# Patient Record
Sex: Female | Born: 1935 | Race: White | Hispanic: No | Smoking: Never smoker
Health system: Southern US, Community
[De-identification: ages and names within clinical notes are randomized; demographics above are authoritative.]

## PROBLEM LIST (undated history)

## (undated) DIAGNOSIS — I10 Essential (primary) hypertension: Secondary | ICD-10-CM

## (undated) DIAGNOSIS — F419 Anxiety disorder, unspecified: Secondary | ICD-10-CM

## (undated) DIAGNOSIS — F0390 Unspecified dementia without behavioral disturbance: Secondary | ICD-10-CM

## (undated) DIAGNOSIS — C50919 Malignant neoplasm of unspecified site of unspecified female breast: Secondary | ICD-10-CM

## (undated) DIAGNOSIS — K219 Gastro-esophageal reflux disease without esophagitis: Secondary | ICD-10-CM

## (undated) DIAGNOSIS — E119 Type 2 diabetes mellitus without complications: Secondary | ICD-10-CM

## (undated) DIAGNOSIS — E785 Hyperlipidemia, unspecified: Secondary | ICD-10-CM

---

## 2004-07-17 ENCOUNTER — Ambulatory Visit: Payer: Self-pay | Admitting: Internal Medicine

## 2004-07-18 ENCOUNTER — Ambulatory Visit: Payer: Self-pay | Admitting: Internal Medicine

## 2004-08-01 ENCOUNTER — Ambulatory Visit: Payer: Self-pay | Admitting: Internal Medicine

## 2005-08-07 ENCOUNTER — Ambulatory Visit: Payer: Self-pay | Admitting: Internal Medicine

## 2007-06-30 ENCOUNTER — Ambulatory Visit: Payer: Self-pay | Admitting: Gastroenterology

## 2007-09-02 ENCOUNTER — Ambulatory Visit: Payer: Self-pay | Admitting: Internal Medicine

## 2009-05-01 ENCOUNTER — Emergency Department: Payer: Self-pay | Admitting: Emergency Medicine

## 2009-07-22 ENCOUNTER — Emergency Department: Payer: Self-pay | Admitting: Emergency Medicine

## 2009-09-05 ENCOUNTER — Ambulatory Visit: Payer: Self-pay | Admitting: Internal Medicine

## 2010-04-11 ENCOUNTER — Inpatient Hospital Stay: Payer: Self-pay | Admitting: Family Medicine

## 2010-12-02 ENCOUNTER — Emergency Department: Payer: Self-pay | Admitting: Unknown Physician Specialty

## 2011-06-14 ENCOUNTER — Emergency Department: Payer: Self-pay | Admitting: Emergency Medicine

## 2012-08-13 ENCOUNTER — Ambulatory Visit: Payer: Self-pay | Admitting: Family Medicine

## 2012-09-03 ENCOUNTER — Ambulatory Visit: Payer: Self-pay | Admitting: Family Medicine

## 2012-09-25 DIAGNOSIS — C50919 Malignant neoplasm of unspecified site of unspecified female breast: Secondary | ICD-10-CM

## 2012-09-25 HISTORY — PX: BREAST LUMPECTOMY: SHX2

## 2012-09-25 HISTORY — DX: Malignant neoplasm of unspecified site of unspecified female breast: C50.919

## 2012-10-04 ENCOUNTER — Ambulatory Visit: Payer: Self-pay | Admitting: Surgery

## 2012-10-09 ENCOUNTER — Emergency Department: Payer: Self-pay | Admitting: Unknown Physician Specialty

## 2012-10-11 ENCOUNTER — Ambulatory Visit: Payer: Self-pay | Admitting: Surgery

## 2012-10-11 LAB — CBC
MCHC: 33.6 g/dL (ref 32.0–36.0)
Platelet: 186 10*3/uL (ref 150–440)
WBC: 5.6 10*3/uL (ref 3.6–11.0)

## 2012-10-11 LAB — BASIC METABOLIC PANEL
Anion Gap: 6 — ABNORMAL LOW (ref 7–16)
BUN: 23 mg/dL — ABNORMAL HIGH (ref 7–18)
Calcium, Total: 9.5 mg/dL (ref 8.5–10.1)
Chloride: 104 mmol/L (ref 98–107)
Co2: 28 mmol/L (ref 21–32)
EGFR (African American): >60
Glucose: 128 mg/dL — ABNORMAL HIGH (ref 65–99)
Osmolality: 281 (ref 275–301)
Potassium: 3.9 mmol/L (ref 3.5–5.1)
Sodium: 138 mmol/L (ref 136–145)

## 2012-11-18 ENCOUNTER — Ambulatory Visit: Payer: Self-pay | Admitting: Surgery

## 2012-11-30 ENCOUNTER — Ambulatory Visit: Payer: Self-pay | Admitting: Oncology

## 2012-12-01 ENCOUNTER — Ambulatory Visit: Payer: Self-pay | Admitting: Oncology

## 2012-12-26 ENCOUNTER — Ambulatory Visit: Payer: Self-pay | Admitting: Oncology

## 2012-12-30 LAB — CBC CANCER CENTER
Basophil %: 0.4 %
Eosinophil %: 3.4 %
HCT: 39.2 % (ref 35.0–47.0)
Lymphocyte %: 34.9 %
Monocyte #: 0.5 x10 3/mm (ref 0.2–0.9)
RDW: 13.4 % (ref 11.5–14.5)
WBC: 5 x10 3/mm (ref 3.6–11.0)

## 2013-01-06 LAB — CBC CANCER CENTER
Basophil %: 0.4 %
Eosinophil #: 0.2 x10 3/mm (ref 0.0–0.7)
Eosinophil %: 3 %
HCT: 40.1 % (ref 35.0–47.0)
Lymphocyte #: 1.8 x10 3/mm (ref 1.0–3.6)
MCHC: 34 g/dL (ref 32.0–36.0)
Monocyte #: 0.6 x10 3/mm (ref 0.2–0.9)
Neutrophil %: 52 %
RDW: 13.4 % (ref 11.5–14.5)
WBC: 5.3 x10 3/mm (ref 3.6–11.0)

## 2013-01-13 LAB — CBC CANCER CENTER
Basophil %: 0.3 %
Eosinophil #: 0.2 x10 3/mm (ref 0.0–0.7)
HGB: 14.4 g/dL (ref 12.0–16.0)
Lymphocyte %: 28.8 %
MCH: 32.4 pg (ref 26.0–34.0)
MCV: 95 fL (ref 80–100)
Monocyte #: 0.5 x10 3/mm (ref 0.2–0.9)
Neutrophil #: 3.2 x10 3/mm (ref 1.4–6.5)
Platelet: 219 x10 3/mm (ref 150–440)
RDW: 13.4 % (ref 11.5–14.5)

## 2013-01-25 ENCOUNTER — Ambulatory Visit: Payer: Self-pay | Admitting: Oncology

## 2013-01-27 LAB — CBC CANCER CENTER
Basophil #: 0 x10 3/mm (ref 0.0–0.1)
Basophil %: 0.6 %
HGB: 14.2 g/dL (ref 12.0–16.0)
MCH: 32.2 pg (ref 26.0–34.0)
MCHC: 34.2 g/dL (ref 32.0–36.0)
Monocyte #: 0.5 x10 3/mm (ref 0.2–0.9)
Platelet: 196 x10 3/mm (ref 150–440)
RDW: 13.8 % (ref 11.5–14.5)

## 2013-02-25 ENCOUNTER — Ambulatory Visit: Payer: Self-pay | Admitting: Oncology

## 2013-08-18 ENCOUNTER — Ambulatory Visit: Payer: Self-pay | Admitting: Oncology

## 2013-09-01 ENCOUNTER — Ambulatory Visit: Payer: Self-pay | Admitting: Surgery

## 2014-11-17 NOTE — Consult Note (Signed)
Reason for Visit: This 81 year old Female patient presents to the clinic for initial evaluation of  breast cancer .   Referred by Dr. Oliva Bustard.  Diagnosis:  Chief Complaint/Diagnosis   pathologic stage I (T1 C. N0 M0) invasive mammary carcinoma status post wide local excision and sentinel node biopsy in 81 year old female with mild dementia  Pathology Report pathology report reviewed   Imaging Report mammograms reviewed   Referral Report linical notes reviewed   Planned Treatment Regimen adjuvant whole breast radiation   HPI   patient is a pleasant 81 year old female who presented with an abnormal mammogram of her right breastshowing iindeterminatenodular in the inferior aspect of the right breast. There were no microcalcifications or architectural distortion. He was confirmed as a hypoechoic nodule suspicious for malignancy on ultrasound. Patient on to have a wide local excision for 1.1 cm invasive mammary carcinoma overall nuclear grade 3. Margins were clear but close at 6 mm. Tumor was ER/PR negative HER-2/neu not overexpressed. One sentinel lymph node was free of metastatic disease. Patient tolerated her surgery well. Has been seen by medical oncology based on her advanced age they have declined systemic chemotherapy. She seen today for opinion regarding radiation therapy. She is doing well. Having no significant breast tenderness or pain.  Past Hx:    GERD - Esophageal Reflux:    Breast Cancer:    Dementia:    Anxiety:    Hypokalemia:    HTN:   Past, Family and Social History:  Past Medical History positive   Cardiovascular hypertension   Gastrointestinal GERD   Neurological/Psychiatric Alzheimer's; anxiety   Family History noncontributory   Social History noncontributory   Additional Past Medical and Surgical History him today by family member today   Allergies:   No Known Allergies:   Home Meds:  Home Medications: Medication Instructions Status   atorvastatin 20 mg oral tablet 1 tab(s) orally once a day (at bedtime) Active  ranitidine 150 mg oral tablet 1 tab(s) orally 2 times a day Active  lisinopril 20 mg oral tablet 1 tab(s) orally once a day (in the morning) Active  omeprazole 20 mg oral delayed release capsule 1-2 cap(s) orally once a day Active  aspirin 81 mg oral tablet 1 tab(s) orally once a day Active   Review of Systems:  General negative   Performance Status (ECOG) 0   Skin negative   Breast see HPI   Ophthalmologic negative   ENMT negative   Respiratory and Thorax negative   Cardiovascular negative   Gastrointestinal negative   Genitourinary negative   Musculoskeletal negative   Neurological negative   Psychiatric negative   Hematology/Lymphatics negative   Endocrine negative   Allergic/Immunologic negative   Review of Systems   except for mild dementia according to the nurse's notesPatient denies any weight loss, fatigue, weakness, fever, chills or night sweats. Patient denies any loss of vision, blurred vision. Patient denies any ringing  of the ears or hearing loss. No irregular heartbeat. Patient denies heart murmur or history of fainting. Patient denies any chest pain or pain radiating to her upper extremities. Patient denies any shortness of breath, difficulty breathing at night, cough or hemoptysis. Patient denies any swelling in the lower legs. Patient denies any nausea vomiting, vomiting of blood, or coffee ground material in the vomitus. Patient denies any stomach pain. Patient states has had normal bowel movements no significant constipation or diarrhea. Patient denies any dysuria, hematuria or significant nocturia. Patient denies any problems walking, swelling in  the joints or loss of balance. Patient denies any skin changes, loss of hair or loss of weight. Patient denies any excessive worrying or anxiety or significant depression. Patient denies any problems with insomnia. Patient denies  excessive thirst, polyuria, polydipsia. Patient denies any swollen glands, patient denies easy bruising or easy bleeding. Patient denies any recent infections, allergies or URI. Patient "s visual fields have not changed significantly in recent time.  Nursing Notes:  Nursing Vital Signs and Chemo Nursing Nursing Notes: *CC Vital Signs Flowsheet:   12-May-14 10:00  Temp Temperature 98.3  Pulse Pulse 68  Respirations Respirations 20  SBP SBP 194  DBP DBP 96  Pain Scale (0-10)  0  Current Weight (kg) (kg) 57.6   Physical Exam:  General/Skin/HEENT:  General normal   Skin normal   Eyes normal   ENMT normal   Head and Neck normal   Additional PE well-developed female in NAD. Lungs are clear to A&P cardiac examination shows regular rate and rhythm. Right breast is a well-healed wide local excision scar. No dominant mass or nodularity is noted in either breast into position examined. No supraclavicular or axillary adenopathy is appreciated.   Breasts/Resp/CV/GI/GU:  Respiratory and Thorax normal   Cardiovascular normal   Gastrointestinal normal   Genitourinary normal   MS/Neuro/Psych/Lymph:  Musculoskeletal normal   Neurological normal   Lymphatics normal   Other Results:  Radiology Results: [place]:    07-Feb-14 08:58, US Breast Right  US Breast Right   REASON FOR EXAM:    av rt focal asymmetry  COMMENTS:       PROCEDURE: [place]  - US BREAST RIGHT  - Sep 03 2012  8:58AM     RESULT: TECHNIQUE: Digital diagnostic right mammograms were obtained. FDA   approved computer-aided detection (CAD) for mammographywas utilized for   this study.    FINDING:      True lateral view and spot compression views of the right breast were   performed. There is a small nodule in the inferior lateral middle third   of the right breast. There is no architectural distortion or clusters of   suspicious microcalcifications.  Real-time sonography of the right breast was performed of the  inferior   lateral quadrant. There is a 6 x 5 x 6 mm hypoechoic right breast mass at   the 7:00 position. The margins are ill-defined. There is no posterior   acoustic shadowing or enhancement.    IMPRESSION:     1.    Indeterminate 6 mm hypoechoic right breast mass at the 7:00   position. Tissue diagnosis recommended.    BI-RADS:  Category 4 - Suspicious Abnormality    A negative mammogram report does not preclude biopsy or other evaluation   of a clinically palpable or otherwise suspicious mass or lesion. Breast   cancer may not be detected by mammography in up to 10% of cases.  Thank you for the opportunity to contribute to the care of your patient.    Dictation site:  1        Verified By: Jennette Banker, M.D., MD  LabUnknown:    07-Feb-14 08:26, Digital Additional Views Rt Breast [facility])  PACS Image     07-Feb-14 08:58, US Breast Right  PACS [facility]:    07-Feb-14 08:26, Digital Additional Views Rt Breast ([facility])  Digital Additional Views Rt Breast (SCR)   REASON FOR EXAM:    av rt focal asymmetry  COMMENTS:  PROCEDURE: MAM - MAM DIG ADDVIEWS RT SCR  - Sep 03 2012  8:26AM     RESULT: TECHNIQUE: Digital diagnostic right mammograms were obtained. FDA   approved computer-aided detection (CAD) for mammography was utilized for   this study.    FINDING:      True lateral view and spot compression views of the right breast were   performed. There is a small nodule in the inferior lateral middle third   of the right breast. There is no architectural distortion or clusters of   suspicious microcalcifications.  Real-time sonography of the right breast was performed of the inferior   lateral quadrant. There is a 6 x 5 x 6 mm hypoechoic right breast mass at   the 7:00 position. The margins are ill-defined. There is no posterior   acoustic shadowing or enhancement.    IMPRESSION:     1.    Indeterminate 6 mm hypoechoic right breast mass at the 7:00    position. Tissue diagnosis recommended.    BI-RADS:  Category 4 - Suspicious Abnormality    A negative mammogram report does not preclude biopsy or other evaluation   of a clinically palpable or otherwise suspicious mass or lesion. Breast   cancer may not be detected by mammography in up to 10% of cases.  Thank you for the opportunity to contributeto the care of your patient.    Dictation site:  1        Verified By: Jennette Banker, M.D., MD   Relevent Results:   Relevant Scans and Labs mammogram and ultrasound are reviewed   Assessment and Plan: Impression:   pathologic stage I invasive mammary carcinoma triple negative disease status post wide local excisionand sentinel node biopsy Plan:   at the stomach to go with whole brain breast radiation. Would plan on delivering 5000 to the right breast with 1400 cGy scar boost. Risks and benefits of treatment clinic skin reaction, fatigue, possible inclusion of some superficial lung were all reviewed in detail with the patient. She and her caregiver seem to understand and comprehend ourtreatment plan well. I have set her up for CT simulation later this week.  I would like to take this opportunity to thank you for allowing me to continue to participate in this patient's care.    CC Referral:  cc: Dr. Tamala Julian, Dr. Richarda Overlie   Electronic Signatures: Baruch Gouty, Roda Shutters (MD)  (Signed 12-May-14 12:40)  Authored: HPI, Diagnosis, Past Hx, PFSH, Allergies, Home Meds, ROS, Nursing Notes, Physical Exam, Other Results, Relevent Results, Encounter Assessment and Plan, CC Referring Physician   Last Updated: 12-May-14 12:40 by Armstead Peaks (MD)

## 2014-11-17 NOTE — Op Note (Signed)
PATIENT NAME:  Annette Clements, Annette Clements MR#:  [id number] DATE OF BIRTH:  11/07/1932  DATE OF PROCEDURE:  11/18/2012  PREOPERATIVE DIAGNOSIS: Carcinoma of the right breast.   POSTOPERATIVE DIAGNOSIS: Carcinoma of the right breast.   PROCEDURE: Right partial mastectomy with axillary sentinel lymph node biopsy.   SURGEON: Rochel Brome, M.D.   ANESTHESIA: General.   INDICATION: This 81 year old female had a mammogram finding of a nodule on the lower outer quadrant right breast. Needle biopsy demonstrated infiltrating ductal carcinoma. Surgery was recommended for definitive treatment. She did have preoperative injection of radioactive technetium sulfur colloid. The skin was viewed. I did not identify any sentinel node on the skin. She also had preoperative x-ray needle localization. These images were reviewed, demonstrating the location of the biopsy marker and with the Kopans wire.   PROCEDURE: The patient was placed on the operating table in the supine position under general anesthesia. The dressing was removed from the right breast exposing the Kopans wire, which entered the breast at approximately 7 o'clock position. The wire was cut 2 cm from the skin. The breast was prepared with ChloraPrep and draped in a sterile manner.   The gamma counter was used to scan the right axilla. There was some radioactivity identified in the inferior aspect of the axilla. There was no grossly palpable mass at this site. An oblique incision was made approximately 5 cm in length, carried down through subcutaneous tissues through superficial fascia, deep within the axilla adjacent to the rib cage. The gamma counter was used to direct the course of the dissection and encountered a soft, rounded lymph node, which was approximately 1.2 cm in dimension and some minimal amount of fatty material  was resected with it. One bleeding point was suture ligated with 4-0 chromic and the ex vivo count was in the range of 35 to 40 counts per  second and submitted for routine pathology. There was no remaining palpable mass within the axilla and there was a minimal degree of radioactivity. No other sentinel lymph nodes found. Hemostasis was intact.   Next, attention was turned to do the right partial mastectomy. Again noted the Kopans wire and I made a curvilinear incision, which is an obliquely oriented, extending from the 5 o'clock to the 8 o'clock position of the breast. It was peripherally located and approximately 1 cm width of skin was resected as a skin ellipse and dissection was carried down through subcutaneous tissues using electrocautery for hemostasis and dissected out a portion of tissue surrounding the thickener of the wire. There was also a palpable firmness within the tissue, which helped to direct the course of the excision and excision was carried down close to the chest wall. The 8 o'clock end of the skin ellipse was tagged with a 3-0 nylon stitch. The margin markers were used to mark the cranial, caudal, medial, lateral and deep margins and submitted for pathology. Wound was inspected. One bleeding point, which was clamped, which was suture ligated with 4-0 chromic. Hemostasis was subsequently intact.   The axillary wound was further inspected and hemostasis was intact. Subcutaneous tissues were closed with 4-0 chromic. The skin was closed with running 5-0 Monocryl subcuticular suture. The pathologist called to report that the margins appeared to be adequate with the closest margin being the superior margin and the tumor was 8 mm from the margin.   Next, the breast wound was further inspected. Several small bleeding points were cauterized. Hemostasis was subsequently intact. The wound was closed with  a running 5-0 Monocryl subcuticular suture. Both wounds were then treated with Dermabond. The patient tolerated the procedure satisfactorily and was prepared for transfer to the recovery room.    ____________________________ Lenna Sciara.  Rochel Brome, MD jws:cc D: 11/18/2012 15:24:04 ET T: 11/18/2012 18:43:36 ET JOB#: [id number]  cc: Loreli Dollar, MD, <Dictator> Loreli Dollar MD ELECTRONICALLY SIGNED 11/22/2012 9:58

## 2015-04-24 ENCOUNTER — Other Ambulatory Visit: Payer: Self-pay | Admitting: Family Medicine

## 2015-04-24 DIAGNOSIS — Z853 Personal history of malignant neoplasm of breast: Secondary | ICD-10-CM

## 2015-05-08 ENCOUNTER — Ambulatory Visit
Admission: RE | Admit: 2015-05-08 | Discharge: 2015-05-08 | Disposition: A | Discharge status: Home / Self Care | Payer: Medicare Other | Source: Ambulatory Visit | Attending: Family Medicine | Admitting: Family Medicine

## 2015-05-08 DIAGNOSIS — Z853 Personal history of malignant neoplasm of breast: Secondary | ICD-10-CM

## 2015-05-08 DIAGNOSIS — Z9889 Other specified postprocedural states: Secondary | ICD-10-CM | POA: Diagnosis not present

## 2015-05-08 HISTORY — DX: Malignant neoplasm of unspecified site of unspecified female breast: C50.919

## 2016-03-22 ENCOUNTER — Emergency Department: Payer: Medicare Other

## 2016-03-22 ENCOUNTER — Encounter: Payer: Self-pay | Admitting: Emergency Medicine

## 2016-03-22 ENCOUNTER — Emergency Department
Admission: EM | Admit: 2016-03-22 | Discharge: 2016-03-22 | Disposition: A | Discharge status: Home / Self Care | Payer: Medicare Other | Attending: Emergency Medicine | Admitting: Emergency Medicine

## 2016-03-22 DIAGNOSIS — Z79899 Other long term (current) drug therapy: Secondary | ICD-10-CM | POA: Diagnosis not present

## 2016-03-22 DIAGNOSIS — Z7982 Long term (current) use of aspirin: Secondary | ICD-10-CM | POA: Diagnosis not present

## 2016-03-22 DIAGNOSIS — Z853 Personal history of malignant neoplasm of breast: Secondary | ICD-10-CM | POA: Diagnosis not present

## 2016-03-22 DIAGNOSIS — R55 Syncope and collapse: Secondary | ICD-10-CM | POA: Diagnosis present

## 2016-03-22 HISTORY — DX: Unspecified dementia, unspecified severity, without behavioral disturbance, psychotic disturbance, mood disturbance, and anxiety: F03.90

## 2016-03-22 LAB — URINALYSIS COMPLETE WITH MICROSCOPIC (ARMC ONLY)
Bacteria, UA: NONE SEEN
Bilirubin Urine: NEGATIVE
Glucose, UA: >500 mg/dL — AB
KETONES UR: NEGATIVE mg/dL
LEUKOCYTES UA: NEGATIVE
NITRITE: NEGATIVE
PH: 6 (ref 5.0–8.0)
PROTEIN: 30 mg/dL — AB
SPECIFIC GRAVITY, URINE: 1.015 (ref 1.005–1.030)
SQUAMOUS EPITHELIAL / LPF: NONE SEEN

## 2016-03-22 LAB — CBC
HCT: 41.7 % (ref 35.0–47.0)
Hemoglobin: 14.1 g/dL (ref 12.0–16.0)
MCH: 32.6 pg (ref 26.0–34.0)
MCHC: 33.8 g/dL (ref 32.0–36.0)
MCV: 96.6 fL (ref 80.0–100.0)
PLATELETS: 246 10*3/uL (ref 150–440)
RBC: 4.32 MIL/uL (ref 3.80–5.20)
RDW: 13.8 % (ref 11.5–14.5)
WBC: 9.5 10*3/uL (ref 3.6–11.0)

## 2016-03-22 LAB — BASIC METABOLIC PANEL
Anion gap: 10 (ref 5–15)
BUN: 17 mg/dL (ref 6–20)
CO2: 26 mmol/L (ref 22–32)
CREATININE: 0.87 mg/dL (ref 0.44–1.00)
Calcium: 9.7 mg/dL (ref 8.9–10.3)
Chloride: 102 mmol/L (ref 101–111)
GFR calc non Af Amer: >60 mL/min (ref >60)
Glucose, Bld: 226 mg/dL — ABNORMAL HIGH (ref 65–99)
POTASSIUM: 3.8 mmol/L (ref 3.5–5.1)
SODIUM: 138 mmol/L (ref 135–145)

## 2016-03-22 LAB — TROPONIN I: Troponin I: <0.03 ng/mL (ref <0.03)

## 2016-03-22 MED ORDER — SODIUM CHLORIDE 0.9 % IV BOLUS (SEPSIS)
1000.0000 mL | Freq: Once | INTRAVENOUS | Status: AC
  Administered 2016-03-22: 1000 mL via INTRAVENOUS

## 2016-03-22 NOTE — ED Provider Notes (Addendum)
Discussed with patient the fact that you can have syncope with stooling all the lab tests are normal. I offered the possibility of admission with family elected to take her home especially since she has a doctor's appointment on Monday. They're aware of the fact that I cannot guarantee any outcome. And there might be something significant going on anyway.   Nena Polio, MD 03/22/16 [id number]    Nena Polio, MD 03/22/16 [phone]

## 2016-03-22 NOTE — ED Triage Notes (Signed)
Per [facility], patient comes from home after a syncopal episode in the bathroom. Patient denies hitting head. Patient did rip off her left big toenail. Patient denies any pain at this time. VSS. Hx of dementia, only oriented to self.

## 2016-03-22 NOTE — ED Provider Notes (Signed)
[facility] Emergency Department Provider Note  ____________________________________________  Time seen: Approximately 8:20 PM  I have reviewed the triage vital signs and the nursing notes.   HISTORY  Chief Complaint Loss of Consciousness   HPI Annette Clements is a 82 y.o. female with a history of breast cancer, dementia, hypertension, hyperlipidemia, and diabetes (not on any medications) and presents for evaluation of syncope. Patient is accompanied by her sister who provides most of the history. According to the sister patient didn't each much today because she had aa apthous ulcer in her mouth. She went to the bathroom this evening and was having a bowel movement. The sister noticed the patient was in the bathroom for a long time and she walked in. She found her on the floor, pale, diaphoretic. EMS was called. Unclear if patient fully passed out or hit her head. Patient has no recollection of the event. Patient denies headache, chest pain, shortness of breath, back pain, abdominal pain. Sister reports patient has never had a prior episode of syncope. No recent illness. According to sister patient is now back to baseline.  Past Medical History:  Diagnosis Date  . Breast cancer ([place]) 09/2012   Right breast cancer - chemotherapy & lumpectomy  . Dementia     There are no active problems to display for this patient.   Past Surgical History:  Procedure Laterality Date  . BREAST LUMPECTOMY Right 09/2012    Prior to Admission medications   Medication Sig Start Date End Date Taking? Authorizing Provider  acetaminophen (TYLENOL) 500 MG tablet Take 1,000 mg by mouth every 8 (eight) hours as needed.    Historical Provider, MD  aspirin EC 81 MG tablet Take 81 mg by mouth daily.    Historical Provider, MD  atorvastatin (LIPITOR) 20 MG tablet Take 20 mg by mouth every evening. 03/18/16   Historical Provider, MD  busPIRone (BUSPAR) 5 MG tablet Take 5 mg by mouth 2  (two) times daily. 03/18/16   Historical Provider, MD  lisinopril (PRINIVIL,ZESTRIL) 40 MG tablet Take 40 mg by mouth daily. 03/18/16   Historical Provider, MD  omeprazole (PRILOSEC) 20 MG capsule Take 20 mg by mouth daily. 02/20/16   Historical Provider, MD  PARoxetine (PAXIL) 40 MG tablet Take 40 mg by mouth daily. 03/18/16   Historical Provider, MD  ranitidine (ZANTAC) 150 MG tablet Take 150 mg by mouth daily. 03/18/16   Historical Provider, MD    Allergies Review of patient's allergies indicates no known allergies.  No family history on file.  Social History Social History  Substance Use Topics  . Smoking status: Not on file  . Smokeless tobacco: Not on file  . Alcohol use Not on file    Review of Systems  Constitutional: Negative for fever. + syncope Eyes: Negative for visual changes. ENT: Negative for sore throat. Cardiovascular: Negative for chest pain. Respiratory: Negative for shortness of breath. Gastrointestinal: Negative for abdominal pain, vomiting or diarrhea. Genitourinary: Negative for dysuria. Musculoskeletal: Negative for back pain. Skin: Negative for rash. Neurological: Negative for headaches, weakness or numbness.  ____________________________________________   PHYSICAL EXAM:  VITAL SIGNS: ED Triage Vitals  Enc Vitals Group     BP 03/22/16 1932 121/81     Pulse Rate 03/22/16 1932 79     Resp 03/22/16 1932 17     Temp 03/22/16 1932 98.1 F (36.7 C)     Temp Source 03/22/16 1932 Oral     SpO2 03/22/16 1932 95 %  Weight 03/22/16 1933 156 lb (70.8 kg)     Height 03/22/16 1933 5\' 3"  (1.6 m)     Head Circumference --      Peak Flow --      Pain Score --      Pain Loc --      Pain Edu? --      Excl. in [place]? --     Constitutional: Alert and oriented x 2. Well appearing and in no apparent distress. HEENT:      Head: Normocephalic and atraumatic.         Eyes: Conjunctivae are normal. Sclera is non-icteric. EOMI. PERRL      Mouth/Throat: Mucous  membranes are moist.       Neck: Supple with no signs of meningismus. Cardiovascular: Regular rate and rhythm. No murmurs, gallops, or rubs. 2+ symmetrical distal pulses are present in all extremities. No JVD. Respiratory: Normal respiratory effort. Lungs are clear to auscultation bilaterally. No wheezes, crackles, or rhonchi.  Gastrointestinal: Soft, non tender, and non distended with positive bowel sounds. No rebound or guarding. Genitourinary: No CVA tenderness. Musculoskeletal: Nontender with normal range of motion in all extremities. No edema, cyanosis, or erythema of extremities. Neurologic: Normal speech and language. Face is symmetric. Moving all extremities. No gross focal neurologic deficits are appreciated. Skin: Skin is warm, dry and intact. No rash noted. Psychiatric: Mood and affect are normal. Speech and behavior are normal.  ____________________________________________   LABS (all labs ordered are listed, but only abnormal results are displayed)  Labs Reviewed  BASIC METABOLIC PANEL - Abnormal; Notable for the following:       Result Value   Glucose, Bld 226 (*)    All other components within normal limits  URINALYSIS COMPLETEWITH MICROSCOPIC ([facility] ONLY) - Abnormal; Notable for the following:    Color, Urine YELLOW (*)    APPearance CLEAR (*)    Glucose, UA >500 (*)    Hgb urine dipstick 1+ (*)    Protein, ur 30 (*)    All other components within normal limits  URINE CULTURE  CBC  TROPONIN I   ____________________________________________  EKG  ED ECG REPORT I, Rudene Re, the attending physician, personally viewed and interpreted this ECG.  Normal sinus rhythm, rate of 77, normal intervals, normal axis, no ST elevations or depressions. ____________________________________________  RADIOLOGY  Head CT: Negative  CXR: PND ____________________________________________   PROCEDURES  Procedure(s) performed: None Procedures Critical Care  performed:  None ____________________________________________   INITIAL IMPRESSION / ASSESSMENT AND PLAN / ED COURSE  82 y.o. female with a history of breast cancer, dementia, hypertension, hyperlipidemia, and diabetes (not on any medications) and presents for evaluation of syncope while having a BM. Patient found pale and diaphoretic. Back to baseline. No recollection of the event. Patient has no complaints at this time. Head CT and labs are within normal limits. Chest x-ray and UA pending. VS WNL. Plan to follow up CXR and UA and if all negative and patient remains at baseline, family is comfortable taking her home. She will be with her sister. Presentation sounds like vasovagal episode. If acute abnormalities on UA and CXR then will proceed accordingly. Care transferred to Dr. Cinda Quest.   Clinical Course    Pertinent labs & imaging results that were available during my care of the patient were reviewed by me and considered in my medical decision making (see chart for details).    ____________________________________________   FINAL CLINICAL IMPRESSION(S) / ED DIAGNOSES  Final diagnoses:  Syncope, unspecified syncope type  Syncope and collapse      NEW MEDICATIONS STARTED DURING THIS VISIT:  Discharge Medication List as of 03/22/2016 10:37 PM       Note:  This document was prepared using Dragon voice recognition software and may include unintentional dictation errors.    Rudene Re, MD 03/23/16 1131

## 2016-03-22 NOTE — ED Provider Notes (Signed)
CLINICAL DATA:  Syncopal episode in bathroom.  EXAM: CHEST  2 VIEW  COMPARISON:  10/09/2012.  FINDINGS: Lower lung volume film than previously. No focal airspace consolidation, pulmonary edema, or pleural effusion. No evidence of pneumothorax. Cardiopericardial silhouette is at upper limits of normal for size. The visualized bony structures of the thorax are intact.  IMPRESSION: No acute cardiopulmonary findings.   Electronically Signed   By: Misty Stanley M.D.   On: 03/22/2016 21:28  Waiting for UA still   Nena Polio, MD 03/22/16 2140

## 2016-03-22 NOTE — Discharge Instructions (Signed)
Please be sure to follow up with her doctor on Monday. Please keep very close eye and her and return for any further problems at all. I would call 911 if you have any problems.

## 2016-03-24 LAB — URINE CULTURE: Culture: NO GROWTH

## 2016-04-29 ENCOUNTER — Encounter: Payer: Self-pay | Admitting: Emergency Medicine

## 2016-04-29 ENCOUNTER — Emergency Department
Admission: EM | Admit: 2016-04-29 | Discharge: 2016-05-01 | Disposition: A | Discharge status: Other Acute Inpatient Hospital | Payer: Medicare Other | Attending: Emergency Medicine | Admitting: Emergency Medicine

## 2016-04-29 ENCOUNTER — Emergency Department: Payer: Medicare Other

## 2016-04-29 DIAGNOSIS — Z79899 Other long term (current) drug therapy: Secondary | ICD-10-CM | POA: Diagnosis not present

## 2016-04-29 DIAGNOSIS — R4182 Altered mental status, unspecified: Secondary | ICD-10-CM | POA: Diagnosis present

## 2016-04-29 DIAGNOSIS — F22 Delusional disorders: Secondary | ICD-10-CM | POA: Diagnosis not present

## 2016-04-29 DIAGNOSIS — R451 Restlessness and agitation: Secondary | ICD-10-CM

## 2016-04-29 DIAGNOSIS — I10 Essential (primary) hypertension: Secondary | ICD-10-CM

## 2016-04-29 DIAGNOSIS — K219 Gastro-esophageal reflux disease without esophagitis: Secondary | ICD-10-CM

## 2016-04-29 DIAGNOSIS — F0390 Unspecified dementia without behavioral disturbance: Secondary | ICD-10-CM

## 2016-04-29 DIAGNOSIS — Z853 Personal history of malignant neoplasm of breast: Secondary | ICD-10-CM | POA: Insufficient documentation

## 2016-04-29 DIAGNOSIS — Z791 Long term (current) use of non-steroidal anti-inflammatories (NSAID): Secondary | ICD-10-CM | POA: Insufficient documentation

## 2016-04-29 DIAGNOSIS — F0391 Unspecified dementia with behavioral disturbance: Secondary | ICD-10-CM | POA: Insufficient documentation

## 2016-04-29 DIAGNOSIS — Z7982 Long term (current) use of aspirin: Secondary | ICD-10-CM | POA: Diagnosis not present

## 2016-04-29 DIAGNOSIS — G308 Other Alzheimer's disease: Secondary | ICD-10-CM | POA: Diagnosis not present

## 2016-04-29 DIAGNOSIS — F0281 Dementia in other diseases classified elsewhere with behavioral disturbance: Secondary | ICD-10-CM | POA: Diagnosis not present

## 2016-04-29 LAB — CBC
HCT: 38.6 % (ref 35.0–47.0)
Hemoglobin: 13.2 g/dL (ref 12.0–16.0)
MCH: 32.2 pg (ref 26.0–34.0)
MCHC: 34.3 g/dL (ref 32.0–36.0)
MCV: 94 fL (ref 80.0–100.0)
Platelets: 283 10*3/uL (ref 150–440)
RBC: 4.11 MIL/uL (ref 3.80–5.20)
RDW: 13.7 % (ref 11.5–14.5)
WBC: 8.6 10*3/uL (ref 3.6–11.0)

## 2016-04-29 LAB — URINALYSIS COMPLETE WITH MICROSCOPIC (ARMC ONLY)
BILIRUBIN URINE: NEGATIVE
Bacteria, UA: NONE SEEN
Glucose, UA: NEGATIVE mg/dL
KETONES UR: NEGATIVE mg/dL
Leukocytes, UA: NEGATIVE
Nitrite: NEGATIVE
PH: 6 (ref 5.0–8.0)
Protein, ur: NEGATIVE mg/dL
RBC / HPF: NONE SEEN RBC/hpf (ref 0–5)
Specific Gravity, Urine: 1.008 (ref 1.005–1.030)
Squamous Epithelial / LPF: NONE SEEN

## 2016-04-29 NOTE — ED Triage Notes (Signed)
Pt arrived via ems from "home." Per ems report pt has dementia and son reports pt has "gotten worse." Pt unable to relay information and son is not currently present.

## 2016-04-29 NOTE — ED Notes (Addendum)
Per son pt  left the son's house 11 [id number] am, Police found on [address] at 1am Monday took back to her house called you. Son stayed the night with her. Pt improved while at her house Monday doesn't remember leaving Sunday. PCP on call called Monday ordered. Haldol 1mg  tid for 10 days. 3 doses (1mg  Monday and Tuesday 2mg  6pm. ) pt yelling trying leave son house. Confusion. Haldol not helping making things worse with behavior and gait. Fall 2 weeks ago and Fracture to left wrist with brace from last Friday twice.  Pt appears pleasantly confusion. Pt denies any pain. Pt was living by herself well til after the fall 2weeks. Things progressively get worse.

## 2016-04-29 NOTE — ED Notes (Signed)
MD at bedside talking to son

## 2016-04-29 NOTE — ED Notes (Signed)
Son left to wait into waiting room to get away for few moments. Pt gets agaited when talking to son. Pt is pleasantly confusion to me. Pt is high fall risk. Pt moved to Room 15 closer to nursing station and fall alarm applied.

## 2016-04-29 NOTE — ED Notes (Signed)
Pt has fall pads on the floor and Fall alarm on the bed.. Pt has yellow socks on, Call light in reach. Pt practiced saying call down fall and using call light. Pt is pleasantly confusion. No pain no distress noted.

## 2016-04-29 NOTE — ED Provider Notes (Signed)
[facility] Emergency Department Provider Note  ____________________________________________  Time seen: Approximately 10:35 PM  I have reviewed the triage vital signs and the nursing notes.   HISTORY  Chief Complaint Altered Mental Status  History is limited due to patient dementia but the majority of the history of present illness is obtained by her son with whom she lives.  HPI Annette Clements is a 82 y.o. female with a history of progressive dementia living at home with her son and 2 other family members presenting with worsening confusion, agitation, and paranoia. The patient's son describes that 2 weeks ago she had a vagal episode while having a bowel movement resulting in syncope and a fall. Since then, her mental status has become progressively more confused, and she has associated agitation with this. She has also been paranoid about staying in the house, and has left in the middle of the night, being found by police. She was prescribed Haldol for her symptoms by the physician on-call for her primary care office, but this medication has not been helping. There are no other medical abnormalities including nausea or vomiting, diarrhea, fever, cough, chest pain or shortness of breath.   Past Medical History:  Diagnosis Date  . Breast cancer ([place]) 09/2012   Right breast cancer - chemotherapy & lumpectomy  . Dementia     There are no active problems to display for this patient.   Past Surgical History:  Procedure Laterality Date  . BREAST LUMPECTOMY Right 09/2012    Current Outpatient Rx  . Order #: [id number] Class: Historical Med  . Order #: [id number] Class: Historical Med  . Order #: [id number] Class: Historical Med  . Order #: [id number] Class: Historical Med  . Order #: [id number] Class: Historical Med  . Order #: [id number] Class: Historical Med  . Order #: [id number] Class: Historical Med  . Order #: [id number] Class: Historical Med     Allergies Review of patient's allergies indicates no known allergies.  No family history on file.  Social History Social History  Substance Use Topics  . Smoking status: Not on file  . Smokeless tobacco: Not on file  . Alcohol use Not on file    Review of Systems Constitutional: No fever/chills.Positive increased agitation. Positive confusion. Eyes: No visual changes. ENT: No sore throat. No congestion or rhinorrhea. Cardiovascular: Denies chest pain. Denies palpitations. Positive recent syncopal episode in the setting of a bowel movement. Respiratory: Denies shortness of breath.  No cough. Gastrointestinal: No abdominal pain.  No nausea, no vomiting.  No diarrhea.  No constipation. Genitourinary: Negative for dysuria. Musculoskeletal: Negative for back pain. Skin: Negative for rash. Neurological: Negative for headaches. No focal numbness, tingling or weakness.   10-point ROS otherwise negative.  ____________________________________________   PHYSICAL EXAM:  VITAL SIGNS: ED Triage Vitals [04/29/16 2203]  Enc Vitals Group     BP (!) 151/86     Pulse Rate 76     Resp 18     Temp 97.9 F (36.6 C)     Temp Source Oral     SpO2 96 %     Weight 157 lb 6.4 oz (71.4 kg)     Height 5\' 3"  (1.6 m)     Head Circumference      Peak Flow      Pain Score      Pain Loc      Pain Edu?      Excl. in [place]?     Constitutional: Alert and oriented only to person and place. She  is comfortable appearing, able to move around in the stretcher without any difficulty or pain.. Well appearing and in no acute distress. Answers questions appropriately. Eyes: Conjunctivae are normal.  EOMI. PERRLA. No scleral icterus. Head: Atraumatic. Nose: No congestion/rhinnorhea. Mouth/Throat: Mucous membranes are moist.  Neck: No stridor.  Supple.   Cardiovascular: Normal rate, regular rhythm. No murmurs, rubs or gallops.  Respiratory: Normal respiratory effort.  No accessory muscle use or  retractions. Lungs CTAB.  No wheezes, rales or ronchi. Gastrointestinal: Soft, nontender and nondistended.  No guarding or rebound.  No peritoneal signs. Musculoskeletal: No LE edema. No ttp in the calves or palpable cords.  Negative Homan's sign. Neurologic:  Alert to person and place, but when asked the year she replies "January.".  Speech is clear.  Face and smile are symmetric.  EOMI.  PERRLA. Moves all extremities well. Skin:  Skin is warm, dry and intact. No rash noted. Psychiatric: Mood and affect are normal. Patient is calm at this time.  ____________________________________________   LABS (all labs ordered are listed, but only abnormal results are displayed)  Labs Reviewed  CBC  COMPREHENSIVE METABOLIC PANEL  TROPONIN I  URINALYSIS COMPLETEWITH MICROSCOPIC ([place])   ____________________________________________  EKG  ED ECG REPORT I, Eula Listen, the attending physician, personally viewed and interpreted this ECG.   Date: 04/29/2016  EKG Time: 2217  Rate: 77  Rhythm: normal sinus rhythm  Axis: Leftward  Intervals:none  ST&T Change: Nonspecific T-wave inversions in V1. No ST elevation.  ____________________________________________  RADIOLOGY  No results found.  ____________________________________________   PROCEDURES  Procedure(s) performed: None  Procedures  Critical Care performed: No ____________________________________________   INITIAL IMPRESSION / ASSESSMENT AND PLAN / ED COURSE  Pertinent labs & imaging results that were available during my care of the patient were reviewed by me and considered in my medical decision making (see chart for details).  82 y.o. female with a history of dementia presenting with 2 weeks of progressively worsening confusion, agitation, and paranoia. Overall, the patient has stable vital signs and no focal neurologic findings on examination except for or disorientation to time. We will evaluate her for  other causes that would precipitate worsening of her dementia or cause her to have some delirium including urinary tract infection, electrolyte abnormality, acute intracranial abnormality. The patient will require admission to the hospital as she is no longer safe at home given her symptoms.  I have signed the patient out to Dr. Dahlia Client who will follow up her imaging and lab results for final disposition.  ____________________________________________  FINAL CLINICAL IMPRESSION(S) / ED DIAGNOSES  Final diagnoses:  Paranoia ([place])  Agitation  Altered mental status, unspecified altered mental status type    Clinical Course      NEW MEDICATIONS STARTED DURING THIS VISIT:  New Prescriptions   No medications on file      Eula Listen, MD 04/29/16 2323

## 2016-04-29 NOTE — ED Notes (Signed)
Per Dr. Mariea Clonts plan to admit for AMS.

## 2016-04-29 NOTE — ED Notes (Signed)
Son is POA.

## 2016-04-29 NOTE — ED Notes (Signed)
Pt lying in bed no distress noted, no attempts to get up unassisted or wandering.

## 2016-04-29 NOTE — ED Notes (Signed)
Patient transported to CT and xray 

## 2016-04-30 DIAGNOSIS — K219 Gastro-esophageal reflux disease without esophagitis: Secondary | ICD-10-CM

## 2016-04-30 DIAGNOSIS — F0281 Dementia in other diseases classified elsewhere with behavioral disturbance: Secondary | ICD-10-CM | POA: Diagnosis not present

## 2016-04-30 DIAGNOSIS — G308 Other Alzheimer's disease: Secondary | ICD-10-CM | POA: Diagnosis not present

## 2016-04-30 DIAGNOSIS — I10 Essential (primary) hypertension: Secondary | ICD-10-CM

## 2016-04-30 DIAGNOSIS — F0390 Unspecified dementia without behavioral disturbance: Secondary | ICD-10-CM

## 2016-04-30 LAB — COMPREHENSIVE METABOLIC PANEL
ALBUMIN: 3.8 g/dL (ref 3.5–5.0)
ALT: 24 U/L (ref 14–54)
AST: 34 U/L (ref 15–41)
Alkaline Phosphatase: 86 U/L (ref 38–126)
Anion gap: 11 (ref 5–15)
BUN: 12 mg/dL (ref 6–20)
CHLORIDE: 103 mmol/L (ref 101–111)
CO2: 25 mmol/L (ref 22–32)
Calcium: 9.6 mg/dL (ref 8.9–10.3)
Creatinine, Ser: 0.74 mg/dL (ref 0.44–1.00)
GFR calc Af Amer: >60 mL/min (ref >60)
GFR calc non Af Amer: >60 mL/min (ref >60)
GLUCOSE: 132 mg/dL — AB (ref 65–99)
POTASSIUM: 3.8 mmol/L (ref 3.5–5.1)
SODIUM: 139 mmol/L (ref 135–145)
Total Bilirubin: 0.2 mg/dL — ABNORMAL LOW (ref 0.3–1.2)
Total Protein: 7.1 g/dL (ref 6.5–8.1)

## 2016-04-30 LAB — FOLATE: Folate: 13.4 ng/mL (ref >5.9)

## 2016-04-30 LAB — VITAMIN B12: Vitamin B-12: 261 pg/mL (ref 180–914)

## 2016-04-30 LAB — TROPONIN I

## 2016-04-30 MED ORDER — ONDANSETRON 4 MG PO TBDP
ORAL_TABLET | ORAL | Status: AC
  Administered 2016-04-30: 4 mg via ORAL
  Filled 2016-04-30: qty 1

## 2016-04-30 MED ORDER — ASPIRIN EC 81 MG PO TBEC
81.0000 mg | DELAYED_RELEASE_TABLET | Freq: Every day | ORAL | Status: DC
  Administered 2016-04-30 – 2016-05-01 (×2): 81 mg via ORAL
  Filled 2016-04-30 (×2): qty 1

## 2016-04-30 MED ORDER — OLANZAPINE 5 MG PO TBDP
2.5000 mg | ORAL_TABLET | Freq: Every day | ORAL | Status: DC
  Administered 2016-04-30: 2.5 mg via ORAL
  Filled 2016-04-30 (×2): qty 0.5

## 2016-04-30 MED ORDER — OLANZAPINE 5 MG PO TABS: 2.5000 mg | ORAL_TABLET | Freq: Every day | ORAL | Status: DC

## 2016-04-30 MED ORDER — FAMOTIDINE 20 MG PO TABS
20.0000 mg | ORAL_TABLET | Freq: Every day | ORAL | Status: DC
  Administered 2016-04-30 – 2016-05-01 (×2): 20 mg via ORAL
  Filled 2016-04-30 (×2): qty 1

## 2016-04-30 MED ORDER — ONDANSETRON 4 MG PO TBDP
4.0000 mg | ORAL_TABLET | Freq: Once | ORAL | Status: AC
  Administered 2016-04-30: 4 mg via ORAL

## 2016-04-30 MED ORDER — PAROXETINE HCL 20 MG PO TABS
40.0000 mg | ORAL_TABLET | Freq: Every day | ORAL | Status: DC
  Administered 2016-04-30 – 2016-05-01 (×2): 40 mg via ORAL
  Filled 2016-04-30 (×2): qty 2

## 2016-04-30 MED ORDER — LISINOPRIL 20 MG PO TABS
40.0000 mg | ORAL_TABLET | Freq: Every day | ORAL | Status: DC
  Administered 2016-04-30: 40 mg via ORAL
  Filled 2016-04-30: qty 2

## 2016-04-30 MED ORDER — ATORVASTATIN CALCIUM 20 MG PO TABS
10.0000 mg | ORAL_TABLET | Freq: Every day | ORAL | Status: DC
  Administered 2016-04-30: 10 mg via ORAL
  Filled 2016-04-30: qty 1

## 2016-04-30 NOTE — ED Notes (Signed)
Pt resting in bed. Bed alarm set and intact. Mats next to bed. Bed close to the nursing station.

## 2016-04-30 NOTE — ED Notes (Signed)
Pt resting in bed, pt given breakfast tray, pt eating

## 2016-04-30 NOTE — ED Notes (Signed)
Lunch meal tray given.  

## 2016-04-30 NOTE — ED Notes (Signed)
Pt continues to try to get out of bed, pt ambulated around unit by ED tech, fall mats in place in room

## 2016-04-30 NOTE — ED Notes (Signed)
Patient complaining of having nausea.  MD notified and see MAR.

## 2016-04-30 NOTE — ED Notes (Signed)
Spoke with counselor who explained that Annette Clements is requiring TSH, urine drug screen, and a EKG in order for patient to get placement.  Orders placed and lab made aware of add-ons.

## 2016-04-30 NOTE — Consult Note (Signed)
[place] Psychiatry Consult   Reason for Consult:  Consult for 82 year old woman brought in by her family because of worsening behavior problems Referring Physician:  Edd Fabian Patient Identification: Annette Clements MRN:  [id number] Principal Diagnosis: Dementia with behavioral disturbance Diagnosis:   Patient Active Problem List   Diagnosis Date Noted  . Dementia with behavioral disturbance [F03.91] 04/30/2016  . Hypertension [I10] 04/30/2016  . Gastric reflux [K21.9] 04/30/2016    Total Time spent with patient: 1 hour  Subjective:   Annette Clements is a 82 y.o. female patient admitted with "I don't know where I am".  HPI:  Patient interviewed. Also interviewed her son who was able to give most of the information. Chart reviewed. Son reports that his mother has been having more behavioral disturbances recently. Over the last few days she has been leaving her usual home and wandering out into the street at night area seems to be more confused. When he has tried to help her and keep her contained she has become hostile and agitated at night. He has not been able to safely contain her and she is too demented and confused to understand. No known changed any of her medicine. No known medical problem. The patient denies feeling sick in any way. Denies pain denies nausea area denies mood symptoms. No evidence of any substance abuse. Lab work up unremarkable no sign of any acute infection or new medical problem. Son feels that it's impossible for him to safely manage her outside the hospital at this point.  Social history: Son appears to be the only living person who is able to take responsibility for her. Patient had been apparently living fairly independently but now is wondering away from home at night. Son had been trying to manage things to get her to live with him but even then he is not home during the day.  Medical history: Patient has high blood pressure past history of breast cancer  history of gastric reflux symptoms. No known stroke.  Substance abuse history: No known past substance abuse history or evidence of recent substance use.  Past Psychiatric History: Son describes the patient as having always been somewhat grumpy and irritable and easily angered but no known past psychiatric treatment or diagnosis. She does show in her medicines that she is currently being treated with Paxil 40 mg a day. I don't know exactly what that is for. No history known of suicide attempts or violence or hospitalization.  Risk to Self: Suicidal Ideation: No Suicidal Intent: No Is patient at risk for suicide?: No Suicidal Plan?: No Access to Means: No What has been your use of drugs/alcohol within the last 12 months?: None identified How many times?: 0 Other Self Harm Risks: Paranoid behavior Triggers for Past Attempts: None known Intentional Self Injurious Behavior: None Risk to Others: Homicidal Ideation: No Thoughts of Harm to Others: No Current Homicidal Intent: No Current Homicidal Plan: No Access to Homicidal Means: No Identified Victim: None identified History of harm to others?: No Assessment of Violence: None Noted Violent Behavior Description: None identified Does patient have access to weapons?: No Criminal Charges Pending?: No Does patient have a court date: No Prior Inpatient Therapy: Prior Inpatient Therapy: No Prior Therapy Dates: n/a Prior Therapy Facilty/Provider(s): n/a Reason for Treatment: n/a Prior Outpatient Therapy: Prior Outpatient Therapy: No Prior Therapy Dates: n/a Prior Therapy Facilty/Provider(s): n/a Reason for Treatment: n/a Does patient have an ACCT team?: No Does patient have Intensive In-House Services?  : No Does  patient have [facility] services? : No Does patient have P4CC services?: No  Past Medical History:  Past Medical History:  Diagnosis Date  . Breast cancer ([place]) 09/2012   Right breast cancer - chemotherapy & lumpectomy  .  Dementia     Past Surgical History:  Procedure Laterality Date  . BREAST LUMPECTOMY Right 09/2012   Family History: No family history on file. Family Psychiatric  History: Son states that many people in the family did have mental health problems but he doesn't have many specifics Social History:  History  Alcohol use Not on file     History  Drug use: Unknown    Social History   Social History  . Marital status: Widowed    Spouse name: N/A  . Number of children: N/A  . Years of education: N/A   Social History Main Topics  . Smoking status: None  . Smokeless tobacco: None  . Alcohol use None  . Drug use: Unknown  . Sexual activity: Not Asked   Other Topics Concern  . None   Social History Narrative  . None   Additional Social History:    Allergies:  No Known Allergies  Labs:  Results for orders placed or performed during the hospital encounter of 04/29/16 (from the past 48 hour(s))  CBC     Status: None   Collection Time: 04/29/16 10:48 PM  Result Value Ref Range   WBC 8.6 3.6 - 11.0 K/uL   RBC 4.11 3.80 - 5.20 MIL/uL   Hemoglobin 13.2 12.0 - 16.0 g/dL   HCT 38.6 35.0 - 47.0 %   MCV 94.0 80.0 - 100.0 fL   MCH 32.2 26.0 - 34.0 pg   MCHC 34.3 32.0 - 36.0 g/dL   RDW 13.7 11.5 - 14.5 %   Platelets 283 150 - 440 K/uL  Comprehensive metabolic panel     Status: Abnormal   Collection Time: 04/29/16 10:48 PM  Result Value Ref Range   Sodium 139 135 - 145 mmol/L   Potassium 3.8 3.5 - 5.1 mmol/L   Chloride 103 101 - 111 mmol/L   CO2 25 22 - 32 mmol/L   Glucose, Bld 132 (H) 65 - 99 mg/dL   BUN 12 6 - 20 mg/dL   Creatinine, Ser 0.74 0.44 - 1.00 mg/dL   Calcium 9.6 8.9 - 10.3 mg/dL   Total Protein 7.1 6.5 - 8.1 g/dL   Albumin 3.8 3.5 - 5.0 g/dL   AST 34 15 - 41 U/L   ALT 24 14 - 54 U/L   Alkaline Phosphatase 86 38 - 126 U/L   Total Bilirubin 0.2 (L) 0.3 - 1.2 mg/dL   GFR calc non Af Amer >60 >60 mL/min   GFR calc Af Amer >60 >60 mL/min    Comment:  (NOTE) The eGFR has been calculated using the CKD EPI equation. This calculation has not been validated in all clinical situations. eGFR's persistently <60 mL/min signify possible Chronic Kidney Disease.    Anion gap 11 5 - 15  Troponin I     Status: None   Collection Time: 04/29/16 10:48 PM  Result Value Ref Range   Troponin I <0.03 <0.03 ng/mL  Urinalysis complete, with microscopic ([facility] only)     Status: Abnormal   Collection Time: 04/29/16 10:48 PM  Result Value Ref Range   Color, Urine STRAW (A) YELLOW   APPearance CLEAR (A) CLEAR   Glucose, UA NEGATIVE NEGATIVE mg/dL   Bilirubin Urine NEGATIVE NEGATIVE  Ketones, ur NEGATIVE NEGATIVE mg/dL   Specific Gravity, Urine 1.008 1.005 - 1.030   Hgb urine dipstick 2+ (A) NEGATIVE   pH 6.0 5.0 - 8.0   Protein, ur NEGATIVE NEGATIVE mg/dL   Nitrite NEGATIVE NEGATIVE   Leukocytes, UA NEGATIVE NEGATIVE   RBC / HPF NONE SEEN 0 - 5 RBC/hpf   WBC, UA 0-5 0 - 5 WBC/hpf   Bacteria, UA NONE SEEN NONE SEEN   Squamous Epithelial / LPF NONE SEEN NONE SEEN   Mucous PRESENT     Current Facility-Administered Medications  Medication Dose Route Frequency Provider Last Rate Last Dose  . aspirin EC tablet 81 mg  81 mg Oral Daily Gonzella Lex, MD      . atorvastatin (LIPITOR) tablet 10 mg  10 mg Oral q1800 Gonzella Lex, MD      . famotidine (PEPCID) tablet 20 mg  20 mg Oral Daily Michae Grimley T Daved Mcfann, MD      . lisinopril (PRINIVIL,ZESTRIL) tablet 40 mg  40 mg Oral Daily Nikita Humble T Akil Hoos, MD      . OLANZapine (ZYPREXA) tablet 2.5 mg  2.5 mg Oral QHS Gonzella Lex, MD      . PARoxetine (PAXIL) tablet 40 mg  40 mg Oral Daily Gonzella Lex, MD       Current Outpatient Prescriptions  Medication Sig Dispense Refill  . acetaminophen (TYLENOL) 500 MG tablet Take 1,000 mg by mouth every 8 (eight) hours as needed.    Marland Kitchen aspirin EC 81 MG tablet Take 81 mg by mouth daily.    Marland Kitchen atorvastatin (LIPITOR) 20 MG tablet Take 20 mg by mouth every evening.    .  busPIRone (BUSPAR) 5 MG tablet Take 5 mg by mouth 2 (two) times daily.    Marland Kitchen lisinopril (PRINIVIL,ZESTRIL) 40 MG tablet Take 40 mg by mouth daily.    Marland Kitchen omeprazole (PRILOSEC) 20 MG capsule Take 20 mg by mouth daily.    Marland Kitchen PARoxetine (PAXIL) 40 MG tablet Take 40 mg by mouth daily.    . ranitidine (ZANTAC) 150 MG tablet Take 150 mg by mouth daily.      Musculoskeletal: Strength & Muscle Tone: decreased Gait & Station: unsteady Patient leans: N/A  Psychiatric Specialty Exam: Physical Exam  Nursing note and vitals reviewed. Constitutional: She appears well-developed and well-nourished.  HENT:  Head: Normocephalic and atraumatic.  Eyes: Conjunctivae are normal. Pupils are equal, round, and reactive to light.  Neck: Normal range of motion.  Cardiovascular: Regular rhythm and normal heart sounds.   Respiratory: Effort normal. No respiratory distress.  GI: Soft.  Musculoskeletal: Normal range of motion.  Neurological: She is alert.  Skin: Skin is warm and dry.  Psychiatric: She has a normal mood and affect. Her speech is tangential. She is slowed. She expresses impulsivity. She expresses no homicidal and no suicidal ideation. She exhibits abnormal recent memory and abnormal remote memory.    Review of Systems  Constitutional: Negative.   HENT: Negative.   Eyes: Negative.   Respiratory: Negative.   Cardiovascular: Negative.   Gastrointestinal: Negative.   Musculoskeletal: Negative.   Skin: Negative.   Neurological: Negative.   Psychiatric/Behavioral: Positive for memory loss. Negative for depression, hallucinations, substance abuse and suicidal ideas. The patient is not nervous/anxious and does not have insomnia.     Blood pressure (!) 149/71, pulse 70, temperature 97.9 F (36.6 C), temperature source Oral, resp. rate (!) 22, height _0  (1.6 m), weight 71.4 kg (157 lb 6.4 oz),  SpO2 96 %.Body mass index is 27.88 kg/m.  General Appearance: Casual  Eye Contact:  Fair  Speech:  Slow   Volume:  Decreased  Mood:  Euthymic  Affect:  Inappropriate and Patient seems a little inappropriately upbeat and giggly but not to a really severe degree.  Thought Process:  Disorganized  Orientation:  Negative  Thought Content:  Rumination and Tangential  Suicidal Thoughts:  No  Homicidal Thoughts:  No  Memory:  Immediate;   Fair Recent;   Poor Remote;   Fair  Judgement:  Impaired  Insight:  Lacking  Psychomotor Activity:  Decreased  Concentration:  Concentration: Poor  Recall:  Poor  Fund of Knowledge:  Poor  Language:  Fair  Akathisia:  No  Handed:  Right  AIMS (if indicated):     Assets:  Armed forces logistics/support/administrative officer Physical Health Social Support  ADL's:  Impaired  Cognition:  Impaired,  Mild and Moderate  Sleep:        Treatment Plan Summary: Daily contact with patient to assess and evaluate symptoms and progress in treatment, Medication management and Plan This is an 82 year old woman who appears to have dementia with behavioral disturbance. No sign yet on any of the lab studies or workup of any new treatable medical problem. Sounds like she is mostly sundowning. I've explained to the patient's son that the options include referral to a geriatric psychiatry hospital, possibly placement if that doesn't work or having her return home. I explained that if she did return home I could prescribe medication but could not assure him that she wouldn't still be agitated or prone to wandering at night. We recommend that we try to refer her to geriatric psychiatry unit. Meanwhile I am going to prescribe 2.5 mg of Zyprexa at nighttime. Will also get a couple other lab studies checked. Son will stay in the loop about the disposition plan.  Disposition: Recommend psychiatric Inpatient admission when medically cleared. Supportive therapy provided about ongoing stressors.  Alethia Berthold, MD 04/30/2016 2:15 PM

## 2016-04-30 NOTE — ED Notes (Signed)
Pt resting in bed, bed alarm on, pt awake and alert in no distress

## 2016-04-30 NOTE — ED Provider Notes (Addendum)
-----------------------------------------   12:18 AM on 04/30/2016 -----------------------------------------   Blood pressure 135/72, pulse 70, temperature 97.9 F (36.6 C), temperature source Oral, resp. rate (!) 23, height 5\' 3"  (1.6 m), weight 157 lb 6.4 oz (71.4 kg), SpO2 96 %.  Assuming care from Dr. Mariea Clonts.  In short, Annette Clements is a 82 y.o. female with a chief complaint of Altered Mental Status .  Refer to the original H&P for additional details.  The current plan of care is to follow up the results of the imaging and blood work and admit the patient.   CT head No acute intracranial hemorrhage.    Age-related atrophy and chronic microvascular ischemic disease.    If symptoms persist and there are no contraindications, MRI may  provide better evaluation if clinically indicated.    CXR:  Fluid or thickening in the right minor fissure. No evidence of active pulmonary disease.  The patient's blood work is unremarkable. The patient will be admitted to the hospitalist service for further evaluation of her paranoid symptoms.  ----------------------------------------- 3:51 AM on 04/30/2016 -----------------------------------------  I spoke to the admitting doctor and they felt that the patient's symptoms were consistent with dementia with psychotic features. He feels that a psych consult may be more appropriate for the patient in terms of evaluation. I will place a TTS and psych consult for the patient to be evaluated.   Loney Hering, MD 04/30/16 [place], MD 04/30/16 [phone]

## 2016-04-30 NOTE — BH Assessment (Signed)
Assessment Note  Annette Clements is an 82 y.o. female ,with a history of progressive dementia, presenting to the ED with worsening confusion, agitation, and paranoia. Patient lives at home with her son and 2 other children. Patient's son describes that 2 weeks ago she had a vagal episode while having a bowel movement resulting in syncope and a fall. Since then, her mental status has become progressively more confused, and she has associated agitation with this. Patient has also been leaving the home during the middle of the night and being pciked up by police. Pt is currently prescribed Haldol but her family report it's not working.   Diagnosis: Dementia  Past Medical History:  Past Medical History:  Diagnosis Date  . Breast cancer ([place]) 09/2012   Right breast cancer - chemotherapy & lumpectomy  . Dementia     Past Surgical History:  Procedure Laterality Date  . BREAST LUMPECTOMY Right 09/2012    Family History: No family history on file.  Social History:  has no tobacco, alcohol, and drug history on file.  Additional Social History:  Alcohol / Drug Use History of alcohol / drug use?: No history of alcohol / drug abuse  CIWA: CIWA-Ar BP: 137/71 Pulse Rate: 70 COWS:    Allergies: No Known Allergies  Home Medications:  (Not in a hospital admission)  OB/GYN Status:  No LMP recorded. Patient is postmenopausal.  General Assessment Data Location of Assessment: [facility] ED TTS Assessment: In system Is this a Tele or Face-to-Face Assessment?: Face-to-Face Is this an Initial Assessment or a Re-assessment for this encounter?: Initial Assessment Marital status: Widowed [place] name: n/a Is patient pregnant?: No Pregnancy Status: No Living Arrangements: Children Can pt return to current living arrangement?: Yes Admission Status: Voluntary Is patient capable of signing voluntary admission?: Yes Referral Source: Self/Family/Friend Insurance type: [facility] Medicare  Medical Screening  Exam ([place]) Medical Exam completed: Yes  Crisis Care Plan Living Arrangements: Children Legal Guardian: Other: (self) Name of Psychiatrist: n/a Name of Therapist: n/a  Education Status Is patient currently in school?: No Current Grade: n/a Highest grade of school patient has completed: n/a Name of school: n/a Contact person: n/a  Risk to self with the past 6 months Suicidal Ideation: No Has patient been a risk to self within the past 6 months prior to admission? : No Suicidal Intent: No Has patient had any suicidal intent within the past 6 months prior to admission? : No Is patient at risk for suicide?: No Suicidal Plan?: No Has patient had any suicidal plan within the past 6 months prior to admission? : No Access to Means: No What has been your use of drugs/alcohol within the last 12 months?: None identified Previous Attempts/Gestures: No How many times?: 0 Other Self Harm Risks: Paranoid behavior Triggers for Past Attempts: None known Intentional Self Injurious Behavior: None Family Suicide History: No Recent stressful life event(s): Recent negative physical changes Persecutory voices/beliefs?: No Depression: Yes Depression Symptoms: Feeling angry/irritable Substance abuse history and/or treatment for substance abuse?: No Suicide prevention information given to non-admitted patients: Not applicable  Risk to Others within the past 6 months Homicidal Ideation: No Does patient have any lifetime risk of violence toward others beyond the six months prior to admission? : No Thoughts of Harm to Others: No Current Homicidal Intent: No Current Homicidal Plan: No Access to Homicidal Means: No Identified Victim: None identified History of harm to others?: No Assessment of Violence: None Noted Violent Behavior Description: None identified Does patient  have access to weapons?: No Criminal Charges Pending?: No Does patient have a court date: No Is patient on  probation?: No  Psychosis Hallucinations: None noted Delusions: Unspecified  Mental Status Report Appearance/Hygiene: In hospital gown Eye Contact: Good Motor Activity: Freedom of movement Speech: Logical/coherent Level of Consciousness: Quiet/awake Mood: Pleasant Affect: Anxious Anxiety Level: Minimal Thought Processes: Relevant Judgement: Partial Orientation: Person, Place Obsessive Compulsive Thoughts/Behaviors: Minimal  Cognitive Functioning Concentration: Good Memory: Recent Intact IQ: Average Insight: Fair Impulse Control: Fair Appetite: Fair Weight Loss: 0 Weight Gain: 0 Sleep: No Change Vegetative Symptoms: None  ADLScreening [facility] Assessment Services) Patient's cognitive ability adequate to safely complete daily activities?: Yes Patient able to express need for assistance with ADLs?: Yes Independently performs ADLs?: Yes (appropriate for developmental age)  Prior Inpatient Therapy Prior Inpatient Therapy: No Prior Therapy Dates: n/a Prior Therapy Facilty/Provider(s): n/a Reason for Treatment: n/a  Prior Outpatient Therapy Prior Outpatient Therapy: No Prior Therapy Dates: n/a Prior Therapy Facilty/Provider(s): n/a Reason for Treatment: n/a Does patient have an ACCT team?: No Does patient have Intensive In-House Services?  : No Does patient have [facility] services? : No Does patient have P4CC services?: No  ADL Screening (condition at time of admission) Patient's cognitive ability adequate to safely complete daily activities?: Yes Patient able to express need for assistance with ADLs?: Yes Independently performs ADLs?: Yes (appropriate for developmental age)       Abuse/Neglect Assessment (Assessment to be complete while patient is alone) Physical Abuse: Denies Verbal Abuse: Denies Sexual Abuse: Denies Exploitation of patient/patient's resources: Denies Self-Neglect: Denies Values / Beliefs Cultural Requests During Hospitalization:  None Spiritual Requests During Hospitalization: None Consults Spiritual Care Consult Needed: No Social Work Consult Needed: No      Additional Information 1:1 In Past 12 Months?: No CIRT Risk: No Elopement Risk: No Does patient have medical clearance?: Yes     Disposition:  Disposition Initial Assessment Completed for this Encounter: Yes Disposition of Patient: Other dispositions Other disposition(s): Other (Comment) (Pending Psych MD consult)  On Site Evaluation by:   Reviewed with Physician:    Oneita Hurt 04/30/2016 6:49 AM

## 2016-04-30 NOTE — ED Notes (Signed)
Dr.Clapacs at bedside  

## 2016-04-30 NOTE — ED Notes (Signed)
Patient becoming anxious.  Patient standing at the door while saying "help".  Patient unable to be redirected back to her chair/bed at this time.  MD notified, see MAR

## 2016-04-30 NOTE — ED Notes (Signed)
Safety Sitter at bedside 

## 2016-04-30 NOTE — ED Notes (Signed)
Patient's family at bedside with MD and RN.

## 2016-04-30 NOTE — ED Notes (Signed)
Patient belongings (pair of earrings and Rx bottle of Haldol 13 tablets) given to patient's son.

## 2016-04-30 NOTE — BH Assessment (Signed)
Writer received phone call from [facility] (Colleen-[phone]) requesting EKG with reading, TSH and UDS. Writer informed the patient's nurse Delilah Shan) the information was needed.

## 2016-04-30 NOTE — ED Notes (Signed)
Admitting MD at bedside, he states he feel this patient needs TTS and psyche consult and there is no medical reason to admit her at this time.

## 2016-04-30 NOTE — BH Assessment (Signed)
Referral information for Geriatric Placement have been faxed to;    [facility] [phone]),    [facility] [phone] or [phone]),    [facility] [phone]),    [facility] [phone])   [facility] [phone]),    [facility] [phone] or [phone]),    [facility] [phone]    [facility] [phone]).

## 2016-05-01 LAB — URINE DRUG SCREEN, QUALITATIVE (ARMC ONLY)
AMPHETAMINES, UR SCREEN: NOT DETECTED
BARBITURATES, UR SCREEN: NOT DETECTED
BENZODIAZEPINE, UR SCRN: NOT DETECTED
Cannabinoid 50 Ng, Ur ~~LOC~~: NOT DETECTED
Cocaine Metabolite,Ur ~~LOC~~: NOT DETECTED
MDMA (Ecstasy)Ur Screen: NOT DETECTED
METHADONE SCREEN, URINE: NOT DETECTED
Opiate, Ur Screen: NOT DETECTED
Phencyclidine (PCP) Ur S: NOT DETECTED
TRICYCLIC, UR SCREEN: NOT DETECTED

## 2016-05-01 LAB — TSH: TSH: 0.909 u[IU]/mL (ref 0.350–4.500)

## 2016-05-01 NOTE — ED Notes (Signed)
Called and verified with Annette Clements patient was still accepted 0720. She indicated patient was still accepted to [facility]

## 2016-05-01 NOTE — ED Notes (Signed)
Pt changed into her clothes, remainder of belongings placed in white bag with a pt sticker on bag.

## 2016-05-01 NOTE — ED Notes (Signed)
Called son rusty and informed where she will be transferred and that going today just not sure time. Number and address of new facility given.

## 2016-05-01 NOTE — ED Provider Notes (Signed)
-----------------------------------------   3:46 AM on 05/01/2016 -----------------------------------------   Blood pressure (!) 154/88, pulse 87, temperature 98.9 F (37.2 C), temperature source Oral, resp. rate 20, height 5\' 3"  (1.6 m), weight 71.4 kg, SpO2 96 %.  The patient had no acute events since last update.  Calm and cooperative at this time.  The patient may be accepted to [facility] in [place] but requires involuntary commitment in order to do so.  I taken out the papers to facilitate her transfer.  ED ECG REPORT I, Jim Lundin, the attending physician, personally viewed and interpreted this ECG.  Date: 04/30/2016 EKG Time: 22:17 Rate: 77 Rhythm: normal sinus rhythm QRS Axis: normal Intervals: normal ST/T Wave abnormalities: normal Conduction Disturbances: none Narrative Interpretation: unremarkable    Hinda Kehr, MD 05/01/16 [phone]

## 2016-05-01 NOTE — ED Notes (Signed)
Called for transport by Sheriff's transport at [phone]

## 2016-05-01 NOTE — BHH Counselor (Signed)
Call received from [facility] with [facility] regarding placement.  Pt has been accepted at their [facility] facility by Dr. Jamse Arn.  Call report to [phone].  Patient can arrive 05/01/16 after 10 am.

## 2016-05-01 NOTE — ED Notes (Signed)
Pt transported via  [facility], pt placed in Detention vehicle and is in custody of Deputy Phillips Odor at this time.

## 2016-05-01 NOTE — ED Notes (Signed)
Pt offered breakfast does not want to eat

## 2016-05-01 NOTE — Consult Note (Signed)
  Psychiatry: Brief follow-up 82 year old woman with dementia. Chart reviewed. Patient has no new complaints. Has generally been stable. Not hostile or threatening. We have made referrals to geriatric psychiatry and are still awaiting transfer. No change to current treatment plan.

## 2016-12-24 ENCOUNTER — Encounter: Payer: Self-pay | Admitting: Emergency Medicine

## 2016-12-24 ENCOUNTER — Inpatient Hospital Stay
Admission: EM | Admit: 2016-12-24 | Discharge: 2016-12-26 | DRG: 641 | Disposition: A | Discharge status: Home / Self Care | Payer: Medicare Other | Attending: Internal Medicine | Admitting: Internal Medicine

## 2016-12-24 ENCOUNTER — Emergency Department: Payer: Medicare Other

## 2016-12-24 DIAGNOSIS — Z66 Do not resuscitate: Secondary | ICD-10-CM | POA: Diagnosis present

## 2016-12-24 DIAGNOSIS — E872 Acidosis, unspecified: Secondary | ICD-10-CM

## 2016-12-24 DIAGNOSIS — K219 Gastro-esophageal reflux disease without esophagitis: Secondary | ICD-10-CM | POA: Diagnosis present

## 2016-12-24 DIAGNOSIS — E861 Hypovolemia: Secondary | ICD-10-CM | POA: Diagnosis present

## 2016-12-24 DIAGNOSIS — Z8673 Personal history of transient ischemic attack (TIA), and cerebral infarction without residual deficits: Secondary | ICD-10-CM | POA: Diagnosis not present

## 2016-12-24 DIAGNOSIS — Z853 Personal history of malignant neoplasm of breast: Secondary | ICD-10-CM | POA: Diagnosis not present

## 2016-12-24 DIAGNOSIS — A419 Sepsis, unspecified organism: Secondary | ICD-10-CM

## 2016-12-24 DIAGNOSIS — Z881 Allergy status to other antibiotic agents status: Secondary | ICD-10-CM | POA: Diagnosis not present

## 2016-12-24 DIAGNOSIS — R55 Syncope and collapse: Secondary | ICD-10-CM | POA: Diagnosis present

## 2016-12-24 DIAGNOSIS — R112 Nausea with vomiting, unspecified: Secondary | ICD-10-CM | POA: Diagnosis present

## 2016-12-24 DIAGNOSIS — R739 Hyperglycemia, unspecified: Secondary | ICD-10-CM | POA: Diagnosis present

## 2016-12-24 DIAGNOSIS — Z885 Allergy status to narcotic agent status: Secondary | ICD-10-CM

## 2016-12-24 DIAGNOSIS — E785 Hyperlipidemia, unspecified: Secondary | ICD-10-CM | POA: Diagnosis present

## 2016-12-24 DIAGNOSIS — Z888 Allergy status to other drugs, medicaments and biological substances status: Secondary | ICD-10-CM

## 2016-12-24 DIAGNOSIS — R7989 Other specified abnormal findings of blood chemistry: Secondary | ICD-10-CM

## 2016-12-24 DIAGNOSIS — Z79899 Other long term (current) drug therapy: Secondary | ICD-10-CM

## 2016-12-24 DIAGNOSIS — R0902 Hypoxemia: Secondary | ICD-10-CM | POA: Diagnosis present

## 2016-12-24 DIAGNOSIS — R7303 Prediabetes: Secondary | ICD-10-CM | POA: Diagnosis present

## 2016-12-24 DIAGNOSIS — Z7982 Long term (current) use of aspirin: Secondary | ICD-10-CM | POA: Diagnosis not present

## 2016-12-24 DIAGNOSIS — I959 Hypotension, unspecified: Secondary | ICD-10-CM

## 2016-12-24 DIAGNOSIS — R1110 Vomiting, unspecified: Secondary | ICD-10-CM

## 2016-12-24 DIAGNOSIS — F0390 Unspecified dementia without behavioral disturbance: Secondary | ICD-10-CM | POA: Diagnosis present

## 2016-12-24 DIAGNOSIS — F419 Anxiety disorder, unspecified: Secondary | ICD-10-CM | POA: Diagnosis present

## 2016-12-24 DIAGNOSIS — J189 Pneumonia, unspecified organism: Secondary | ICD-10-CM

## 2016-12-24 DIAGNOSIS — I639 Cerebral infarction, unspecified: Secondary | ICD-10-CM

## 2016-12-24 DIAGNOSIS — I10 Essential (primary) hypertension: Secondary | ICD-10-CM | POA: Diagnosis present

## 2016-12-24 HISTORY — DX: Anxiety disorder, unspecified: F41.9

## 2016-12-24 HISTORY — DX: Hyperlipidemia, unspecified: E78.5

## 2016-12-24 HISTORY — DX: Essential (primary) hypertension: I10

## 2016-12-24 HISTORY — DX: Gastro-esophageal reflux disease without esophagitis: K21.9

## 2016-12-24 LAB — PROCALCITONIN: Procalcitonin: <0.1 ng/mL

## 2016-12-24 LAB — COMPREHENSIVE METABOLIC PANEL
ALBUMIN: 3.6 g/dL (ref 3.5–5.0)
ALK PHOS: 84 U/L (ref 38–126)
ALT: 21 U/L (ref 14–54)
AST: 28 U/L (ref 15–41)
Anion gap: 6 (ref 5–15)
BUN: 17 mg/dL (ref 6–20)
CALCIUM: 8.8 mg/dL — AB (ref 8.9–10.3)
CHLORIDE: 109 mmol/L (ref 101–111)
CO2: 25 mmol/L (ref 22–32)
CREATININE: 0.74 mg/dL (ref 0.44–1.00)
GFR calc Af Amer: >60 mL/min (ref >60)
GFR calc non Af Amer: >60 mL/min (ref >60)
GLUCOSE: 140 mg/dL — AB (ref 65–99)
Potassium: 3.9 mmol/L (ref 3.5–5.1)
Sodium: 140 mmol/L (ref 135–145)
Total Bilirubin: 0.4 mg/dL (ref 0.3–1.2)
Total Protein: 6.6 g/dL (ref 6.5–8.1)

## 2016-12-24 LAB — URINALYSIS, COMPLETE (UACMP) WITH MICROSCOPIC
BACTERIA UA: NONE SEEN
Bilirubin Urine: NEGATIVE
Glucose, UA: 50 mg/dL — AB
HGB URINE DIPSTICK: NEGATIVE
Ketones, ur: NEGATIVE mg/dL
Leukocytes, UA: NEGATIVE
NITRITE: NEGATIVE
Protein, ur: 30 mg/dL — AB
SPECIFIC GRAVITY, URINE: 1.017 (ref 1.005–1.030)
pH: 5 (ref 5.0–8.0)

## 2016-12-24 LAB — TROPONIN I

## 2016-12-24 LAB — CBC WITH DIFFERENTIAL/PLATELET
BASOS ABS: 0 10*3/uL (ref 0–0.1)
BASOS PCT: 1 %
Eosinophils Absolute: 0.2 10*3/uL (ref 0–0.7)
Eosinophils Relative: 3 %
HCT: 36.9 % (ref 35.0–47.0)
Hemoglobin: 12 g/dL (ref 12.0–16.0)
Lymphocytes Relative: 37 %
Lymphs Abs: 2.3 10*3/uL (ref 1.0–3.6)
MCH: 29.7 pg (ref 26.0–34.0)
MCHC: 32.7 g/dL (ref 32.0–36.0)
MCV: 91 fL (ref 80.0–100.0)
Monocytes Absolute: 0.4 10*3/uL (ref 0.2–0.9)
Monocytes Relative: 6 %
NEUTROS ABS: 3.3 10*3/uL (ref 1.4–6.5)
NEUTROS PCT: 53 %
Platelets: 249 10*3/uL (ref 150–440)
RBC: 4.05 MIL/uL (ref 3.80–5.20)
RDW: 14.8 % — AB (ref 11.5–14.5)
WBC: 6.2 10*3/uL (ref 3.6–11.0)

## 2016-12-24 LAB — MAGNESIUM: MAGNESIUM: 1.6 mg/dL — AB (ref 1.7–2.4)

## 2016-12-24 LAB — LACTIC ACID, PLASMA
LACTIC ACID, VENOUS: 2.9 mmol/L — AB (ref 0.5–1.9)
LACTIC ACID, VENOUS: 2.8 mmol/L — AB (ref 0.5–1.9)

## 2016-12-24 LAB — BRAIN NATRIURETIC PEPTIDE: B NATRIURETIC PEPTIDE 5: 6 pg/mL (ref 0.0–100.0)

## 2016-12-24 LAB — LIPASE, BLOOD: Lipase: 31 U/L (ref 11–51)

## 2016-12-24 MED ORDER — MAGNESIUM SULFATE 2 GM/50ML IV SOLN
2.0000 g | Freq: Once | INTRAVENOUS | Status: AC
  Administered 2016-12-24: 2 g via INTRAVENOUS
  Filled 2016-12-24: qty 50

## 2016-12-24 MED ORDER — DEXTROSE 5 % IV SOLN
500.0000 mg | INTRAVENOUS | Status: DC
  Filled 2016-12-24: qty 500

## 2016-12-24 MED ORDER — CEFTRIAXONE SODIUM IN DEXTROSE 20 MG/ML IV SOLN
1.0000 g | Freq: Once | INTRAVENOUS | Status: AC
  Administered 2016-12-24: 1 g via INTRAVENOUS
  Filled 2016-12-24: qty 50

## 2016-12-24 MED ORDER — SODIUM CHLORIDE 0.9 % IV BOLUS (SEPSIS)
1000.0000 mL | Freq: Once | INTRAVENOUS | Status: AC
  Administered 2016-12-24: 1000 mL via INTRAVENOUS

## 2016-12-24 MED ORDER — SODIUM CHLORIDE 0.9 % IV BOLUS (SEPSIS): 1750.0000 mL | Freq: Once | INTRAVENOUS | Status: DC

## 2016-12-24 MED ORDER — DEXTROSE 5 % IV SOLN
1.0000 g | INTRAVENOUS | Status: DC
  Filled 2016-12-24: qty 10

## 2016-12-24 MED ORDER — DEXTROSE 5 % IV SOLN
500.0000 mg | Freq: Once | INTRAVENOUS | Status: AC
  Administered 2016-12-24: 500 mg via INTRAVENOUS
  Filled 2016-12-24: qty 500

## 2016-12-24 MED ORDER — SODIUM CHLORIDE 0.9 % IV SOLN
Freq: Once | INTRAVENOUS | Status: AC
  Administered 2016-12-24: 22:00:00 via INTRAVENOUS

## 2016-12-24 MED ORDER — ONDANSETRON HCL 4 MG/2ML IJ SOLN
INTRAMUSCULAR | Status: AC
  Filled 2016-12-24: qty 2

## 2016-12-24 MED ORDER — ONDANSETRON HCL 4 MG/2ML IJ SOLN
4.0000 mg | Freq: Once | INTRAMUSCULAR | Status: AC
  Administered 2016-12-24: 4 mg via INTRAVENOUS

## 2016-12-24 NOTE — ED Provider Notes (Addendum)
[facility] Emergency Department Provider Note  ____________________________________________   First MD Initiated Contact with Patient 12/24/16 1926     (approximate)  I have reviewed the triage vital signs and the nursing notes.   HISTORY  Chief Complaint Loss of Consciousness  Level 5 caveat:  history/ROS limited by chronic dementia  HPI Annette Clements is a 83 y.o. female who arrives by EMS for evaluation after a syncopal episode while in the shower at home.  According to EMS her son was assisting her in the shower and she was using a shower seat when she passed out.  He reports that she had some generalized twitching activity and at a decreased level of responsiveness afterwards.  EMS reports that she seems somnolent to them as well but after receiving a fluid bolus of 500 mL of normal saline in route she seems to be back to her baseline.  She had one episode of emesis for EMS.  She currently is in no distress and denies any symptoms.  Her son is on her way and will help to provide additional history.  Due to the patient's chronic dementia she is unable to provide any recent history or review of systems.  The EMS team reports that she was initially borderline hypoxemic at 91% on room air and they started her on the nonrebreather.  He also reported that she was borderline hypotensivewith a systolic blood pressure in the 90s.  Triage vital signs are pending at this time.   Past Medical History:  Diagnosis Date  . Breast cancer ([place]) 09/2012   Right breast cancer - chemotherapy & lumpectomy  . Dementia     Patient Active Problem List   Diagnosis Date Noted  . Dementia with behavioral disturbance 04/30/2016  . Hypertension 04/30/2016  . Gastric reflux 04/30/2016    Past Surgical History:  Procedure Laterality Date  . BREAST LUMPECTOMY Right 09/2012    Prior to Admission medications   Medication Sig Start Date End Date Taking? Authorizing Provider   acetaminophen (TYLENOL) 500 MG tablet Take 1,000 mg by mouth every 8 (eight) hours as needed.    [provider]  aspirin EC 81 MG tablet Take 81 mg by mouth daily.    [provider]  atorvastatin (LIPITOR) 20 MG tablet Take 20 mg by mouth every evening. 03/18/16   [provider]  busPIRone (BUSPAR) 5 MG tablet Take 5 mg by mouth 2 (two) times daily. 03/18/16   [provider]  lisinopril (PRINIVIL,ZESTRIL) 40 MG tablet Take 40 mg by mouth daily. 03/18/16   [provider]  omeprazole (PRILOSEC) 20 MG capsule Take 20 mg by mouth daily. 02/20/16   [provider]  PARoxetine (PAXIL) 40 MG tablet Take 40 mg by mouth daily. 03/18/16   [provider]  ranitidine (ZANTAC) 150 MG tablet Take 150 mg by mouth daily. 03/18/16   [provider]    Allergies Patient has no known allergies.  No family history on file.  Social History Social History  Substance Use Topics  . Smoking status: Never Smoker  . Smokeless tobacco: Never Used  . Alcohol use No    Review of Systems Level 5 caveat:  history/ROS limited by chronic dementia  ____________________________________________   PHYSICAL EXAM:  ED Triage Vitals  Enc Vitals Group     BP 12/24/16 1929 (!) 87/58     Pulse Rate 12/24/16 1929 62     Resp 12/24/16 1929 18  Temp 12/24/16 1929 97.8 F (36.6 C)     Temp Source 12/24/16 1929 Oral     SpO2 12/24/16 1929 90 %     Weight 12/24/16 1932 74.8 kg (165 lb)     Height 12/24/16 1932 1.575 m (5\' 2" )     Head Circumference --      Peak Flow --      Pain Score --      Pain Loc --      Pain Edu? --      Excl. in [place]? --      Constitutional: Alert and oriented to self and location.  Elderly but not in any distress and is not ill-appearing at this time Eyes: Conjunctivae are normal. PERRL. EOMI. Head: Atraumatic. Nose: No congestion/rhinnorhea. Mouth/Throat: Mucous membranes are moist. Neck: No stridor.  No  meningeal signs.   Cardiovascular: Normal rate, regular rhythm. Good peripheral circulation. Grossly normal heart sounds. Respiratory: Normal respiratory effort.  No retractions. Lungs CTAB. Gastrointestinal: Soft and nontender. No distention.  Musculoskeletal: No lower extremity tenderness nor edema. No gross deformities of extremities. Neurologic:  Normal speech and language. No gross focal neurologic deficits are appreciated.  Skin:  Skin is warm, dry and intact. No rash noted.   ____________________________________________   LABS (all labs ordered are listed, but only abnormal results are displayed)  Labs Reviewed  CBC WITH DIFFERENTIAL/PLATELET - Abnormal; Notable for the following:       Result Value   RDW 14.8 (*)    All other components within normal limits  LACTIC ACID, PLASMA - Abnormal; Notable for the following:    Lactic Acid, Venous 2.9 (*)    All other components within normal limits  URINALYSIS, COMPLETE (UACMP) WITH MICROSCOPIC - Abnormal; Notable for the following:    Color, Urine YELLOW (*)    APPearance HAZY (*)    Glucose, UA 50 (*)    Protein, ur 30 (*)    Squamous Epithelial / LPF 0-5 (*)    All other components within normal limits  COMPREHENSIVE METABOLIC PANEL - Abnormal; Notable for the following:    Glucose, Bld 140 (*)    Calcium 8.8 (*)    All other components within normal limits  MAGNESIUM - Abnormal; Notable for the following:    Magnesium 1.6 (*)    All other components within normal limits  CULTURE, BLOOD (ROUTINE X 2)  CULTURE, BLOOD (ROUTINE X 2)  URINE CULTURE  LIPASE, BLOOD  TROPONIN I  LACTIC ACID, PLASMA  BRAIN NATRIURETIC PEPTIDE  PROCALCITONIN   ____________________________________________  EKG  ED ECG REPORT I, Saarah Dewing, the attending physician, personally viewed and interpreted this ECG.  Date: 12/24/2016 EKG Time: 19:32 Rate: 65 Rhythm: normal sinus rhythm QRS Axis: normal Intervals: normal ST/T Wave  abnormalities: Non-specific ST segment / T-wave changes, but no evidence of acute ischemia. Conduction Disturbances: none Narrative Interpretation: unremarkable With no evidence of acute ischemia  ____________________________________________  RADIOLOGY   Dg Chest Port 1 View  Result Date: 12/24/2016 CLINICAL DATA:  Syncopal episode.  Sepsis. EXAM: PORTABLE CHEST 1 VIEW COMPARISON:  04/29/2016 FINDINGS: Single view of the chest demonstrates increased densities at the left lung base. Negative for pneumothorax. Cardiomediastinal silhouette is prominent but may be accentuated by the portable AP technique. Bone structures appear to be intact. IMPRESSION: Left basilar densities are nonspecific. Findings could be related to atelectasis and possibly some airspace disease. This could be better characterized with a two view chest examination. Electronically Signed   By:  Markus Daft M.D.   On: 12/24/2016 19:59    ____________________________________________   PROCEDURES  Critical Care performed: Yes, see critical care procedure note(s)   Procedure(s) performed:   .Critical Care Performed by: Hinda Kehr Authorized by: Hinda Kehr   Critical care provider statement:    Critical care time (minutes):  45   Critical care time was exclusive of:  Separately billable procedures and treating other patients   Critical care was necessary to treat or prevent imminent or life-threatening deterioration of the following conditions:  Sepsis   Critical care was time spent personally by me on the following activities:  Development of treatment plan with patient or surrogate, discussions with consultants, evaluation of patient's response to treatment, examination of patient, obtaining history from patient or surrogate, ordering and performing treatments and interventions, ordering and review of laboratory studies, ordering and review of radiographic studies, pulse oximetry, re-evaluation of patient's condition  and review of old charts      ____________________________________________   INITIAL IMPRESSION / [place] / ED COURSE  Pertinent labs & imaging results that were available during my care of the patient were reviewed by me and considered in my medical decision making (see chart for details).   Clinical Course as of Dec 24 2199  Wed Dec 24, 2016  1927 I evaluated the patient as she arrived by EMS.  She is alert and oriented to self and location that seems to be confabulating and clearly does not remember what happened.  She is in no distress and is pleasant and responds appropriately to questions about how she feels.  She denies any discomfort at this time.  I will check basic labs and we will see what are her initial triage vital signs to determine how to proceed but it is most likely that she had a vasovagal episode while in the shower.  She received 500 mL of normal saline prior to arrival by EMS and I will hold off any additional fluids at this time.  [CF]  1936 The patient's initial triage vital signs are concerning for hypotension and borderline hypoxemia.  I will proceed with a sepsis workup although she does not meet sepsis criteria at this time but we will err on the side of caution.  Until I have a source, however, I will not provide empiric antibiotics  [CF]  1942 The patient came with DO NOT RESUSCITATE documentation and I have reflected this in the EMR  [CF]  2021 DG Chest Port 1 View [CF]  2021 Questionable LLL opacity, but this would be consistent with her current presentation of hypotension and hypoxemia.  I will treat empirically for CAP while the rest of the workup is pending.  [CF]  2117 The patient's lactate is elevated at 2.9 but is not greater than 4 and I feel that at her age and level of general debilitation, tried to give her boluses of 2.25 L of IV fluids (30 mL/kg) would be excessive.  Her blood pressure has stabilized currently.  I will start her on a rate  and await the rest of her results and then admit her to the hospitalist for further management Lactic Acid, Venous: (!!) 2.9 [CF]  2201 Discussed by phone with Dr. Jannifer Franklin who will admit.  I also discussed the case with the patient's son who is at bedside and he agrees with the plan  [CF]    Clinical Course User Index [CF] Hinda Kehr, MD    ____________________________________________  FINAL CLINICAL  IMPRESSION(S) / ED DIAGNOSES  Final diagnoses:  Sepsis, due to unspecified organism ([place])  Elevated lactic acid level  Hypotension, unspecified hypotension type  Vomiting, intractability of vomiting not specified, presence of nausea not specified, unspecified vomiting type  Dementia without behavioral disturbance, unspecified dementia type  Syncope, unspecified syncope type  DNR (do not resuscitate)     MEDICATIONS GIVEN DURING THIS VISIT:  Medications  azithromycin (ZITHROMAX) 500 mg in dextrose 5 % 250 mL IVPB (500 mg Intravenous Restarted 12/24/16 2147)  magnesium sulfate IVPB 2 g 50 mL (not administered)  sodium chloride 0.9 % bolus 1,000 mL (0 mLs Intravenous Stopped 12/24/16 2154)  cefTRIAXone (ROCEPHIN) 1 g in dextrose 5 % 50 mL IVPB - Premix (0 g Intravenous Stopped 12/24/16 2130)  ondansetron (ZOFRAN) injection 4 mg (4 mg Intravenous Given 12/24/16 2038)  0.9 %  sodium chloride infusion ( Intravenous New Bag/Given 12/24/16 2153)     NEW OUTPATIENT MEDICATIONS STARTED DURING THIS VISIT:  New Prescriptions   No medications on file    Modified Medications   No medications on file    Discontinued Medications   No medications on file     Note:  This document was prepared using Dragon voice recognition software and may include unintentional dictation errors.    Hinda Kehr, MD 12/24/16 2156    Hinda Kehr, MD 12/24/16 2201

## 2016-12-24 NOTE — ED Notes (Signed)
Son left bag of clothes for pt by the beside

## 2016-12-24 NOTE — H&P (Signed)
[place] [facility] [place] NAME: Armandina Iman    MR#:  [id number]  DATE OF BIRTH:  06/28/1933  DATE OF ADMISSION:  12/24/2016  PRIMARY CARE PHYSICIAN: Dion Body, MD   REQUESTING/REFERRING PHYSICIAN: Karma Greaser, MD  CHIEF COMPLAINT:   Chief Complaint  Patient presents with  . Loss of Consciousness    HISTORY OF PRESENT ILLNESS:  Dashay Giesler  is a 83 y.o. female who presents with Syncopal episode, vomiting, hypoxia.  Patient has dementia and cannot contribute to her history of present illness. Information is taken from her son who is at bedside with her. He states that he was showering her today, when she had a syncopal episode. When she arrived to the ED she was hypoxic. Workup in the ED shows likely pneumonia. Hospitalists were called for admission.  PAST MEDICAL HISTORY:   Past Medical History:  Diagnosis Date  . Anxiety   . Breast cancer ([place]) 09/2012   Right breast cancer - chemotherapy & lumpectomy  . Dementia   . GERD (gastroesophageal reflux disease)   . HLD (hyperlipidemia)   . HTN (hypertension)     PAST SURGICAL HISTORY:   Past Surgical History:  Procedure Laterality Date  . BREAST LUMPECTOMY Right 09/2012    SOCIAL HISTORY:   Social History  Substance Use Topics  . Smoking status: Never Smoker  . Smokeless tobacco: Never Used  . Alcohol use No    FAMILY HISTORY:   Family History  Problem Relation Age of Onset  . Cancer Sister     DRUG ALLERGIES:  No Known Allergies  MEDICATIONS AT HOME:   Prior to Admission medications   Medication Sig Start Date End Date Taking? Authorizing Provider  acetaminophen (TYLENOL) 500 MG tablet Take 1,000 mg by mouth every 8 (eight) hours as needed.    [provider]  aspirin EC 81 MG tablet Take 81 mg by mouth daily.    [provider]  atorvastatin (LIPITOR) 20 MG tablet Take 20 mg by mouth every evening. 03/18/16   [provider]  busPIRone (BUSPAR) 5 MG tablet Take 5 mg by mouth 2 (two) times daily. 03/18/16   [provider]  lisinopril (PRINIVIL,ZESTRIL) 40 MG tablet Take 40 mg by mouth daily. 03/18/16   [provider]  omeprazole (PRILOSEC) 20 MG capsule Take 20 mg by mouth daily. 02/20/16   [provider]  PARoxetine (PAXIL) 40 MG tablet Take 40 mg by mouth daily. 03/18/16   [provider]  ranitidine (ZANTAC) 150 MG tablet Take 150 mg by mouth daily. 03/18/16   [provider]    REVIEW OF SYSTEMS:  Review of Systems  Unable to perform ROS: Acuity of condition     VITAL SIGNS:   Vitals:   12/24/16 1945 12/24/16 2021 12/24/16 2100 12/24/16 2154  BP:  (!) 90/50 127/62 103/71  Pulse: 64 (!) 57 63 73  Resp: 18 18 19 15   Temp:      TempSrc:      SpO2: 98% 100% 100% 98%  Weight:      Height:       Wt Readings from Last 3 Encounters:  12/24/16 74.8 kg (165 lb)  04/29/16 71.4 kg (157 lb 6.4 oz)  03/22/16 70.8 kg (156 lb)    PHYSICAL EXAMINATION:  Physical Exam  Vitals reviewed. Constitutional: She appears well-developed and well-nourished. No distress.  HENT:  Head: Normocephalic and atraumatic.  Mouth/Throat: Oropharynx is clear and moist.  Eyes: Conjunctivae and EOM are normal. Pupils are equal, round, and reactive to light. No scleral icterus.  Neck: Normal range of motion. Neck supple. No JVD present. No thyromegaly present.  Cardiovascular: Normal rate, regular rhythm and intact distal pulses.  Exam reveals no gallop and no friction rub.   No murmur heard. Respiratory: Effort normal. No respiratory distress. She has no wheezes. She has rales (Left base).  GI: Soft. Bowel sounds are normal. She exhibits no distension. There is no tenderness.  Musculoskeletal: Normal range of motion. She exhibits no edema.  No arthritis, no gout  Lymphadenopathy:    She has no cervical adenopathy.  Neurological: She is alert. No cranial nerve deficit.  No  dysarthria, no aphasia  Skin: Skin is warm and dry. No rash noted. No erythema.  Psychiatric:  Unable to assess due to patient condition    LABORATORY PANEL:   CBC  Recent Labs Lab 12/24/16 1932  WBC 6.2  HGB 12.0  HCT 36.9  PLT 249   ------------------------------------------------------------------------------------------------------------------  Chemistries   Recent Labs Lab 12/24/16 2108  NA 140  K 3.9  CL 109  CO2 25  GLUCOSE 140*  BUN 17  CREATININE 0.74  CALCIUM 8.8*  MG 1.6*  AST 28  ALT 21  ALKPHOS 84  BILITOT 0.4   ------------------------------------------------------------------------------------------------------------------  Cardiac Enzymes  Recent Labs Lab 12/24/16 2108  TROPONINI <0.03   ------------------------------------------------------------------------------------------------------------------  RADIOLOGY:  Dg Chest Port 1 View  Result Date: 12/24/2016 CLINICAL DATA:  Syncopal episode.  Sepsis. EXAM: PORTABLE CHEST 1 VIEW COMPARISON:  04/29/2016 FINDINGS: Single view of the chest demonstrates increased densities at the left lung base. Negative for pneumothorax. Cardiomediastinal silhouette is prominent but may be accentuated by the portable AP technique. Bone structures appear to be intact. IMPRESSION: Left basilar densities are nonspecific. Findings could be related to atelectasis and possibly some airspace disease. This could be better characterized with a two view chest examination. Electronically Signed   By: Markus Daft M.D.   On: 12/24/2016 19:59    EKG:   Orders placed or performed during the hospital encounter of 12/24/16  . ED EKG  . ED EKG  . EKG 12-Lead  . EKG 12-Lead    IMPRESSION AND PLAN:  Principal Problem:   CAP (community acquired pneumonia) - White blood cell count normal, blood pressure stable, oxygen stable with supplemental O2 via nasal cannula, IV antibiotics in place Active Problems:   Syncope - likely  due to hypoxia from pneumonia, potentially vasovagal. We'll trend her cardiac enzymes, get an echocardiogram as initial workup   Hypertension - continue home meds   Dementia - continue home meds   Gastric reflux - home dose PPI  All the records are reviewed and case discussed with ED provider. Management plans discussed with the patient and/or family.  DVT PROPHYLAXIS: SubQ lovenox  GI PROPHYLAXIS: PPI  ADMISSION STATUS: Inpatient  CODE STATUS: DNR    Code Status Orders        Start     Ordered   12/24/16 1943  Do not attempt resuscitation/DNR  Continuous    Question Answer Comment  In the event of cardiac or respiratory ARREST Do not call a "code blue"   In the event of cardiac or respiratory ARREST Do not perform Intubation, CPR, defibrillation or ACLS   In the event of cardiac or respiratory ARREST Use medication by any route, position, wound care, and other measures to relive pain and suffering. May use oxygen, suction and  manual treatment of airway obstruction as needed for comfort.      12/24/16 1942    Code Status History    Date Active Date Inactive Code Status Order ID Comments User Context   This patient has a current code status but no historical code status.    Advance Directive Documentation     Most Recent Value  Type of Advance Directive  Out of facility DNR (pink MOST or yellow form)  Pre-existing out of facility DNR order (yellow form or pink MOST form)  Yellow form placed in chart (order not valid for inpatient use)  "MOST" Form in Place?  -      TOTAL TIME TAKING CARE OF THIS PATIENT: 45 minutes.   Jannifer Franklin, Dadrian [place] 12/24/2016, 10:21 PM  [facility]  Office  [phone]  CC: Primary care physician; Dion Body, MD  Note:  This document was prepared using Dragon voice recognition software and may include unintentional dictation errors.

## 2016-12-24 NOTE — ED Notes (Signed)
Pt laid flat to attempt in and out cath, pt vomited x 3. Pt linen and gown changed. Verbal order for Zofran given per Karma Greaser, MD.

## 2016-12-24 NOTE — ED Notes (Signed)
Second lactic drawn

## 2016-12-24 NOTE — Progress Notes (Signed)
Pharmacy Antibiotic Note  Annette Clements is a 83 y.o. female admitted on 12/24/2016 with pneumonia.  Pharmacy has been consulted for ceftriaxone and azithromycin dosing.  Plan: Ceftriaxone 1 gram q 24 hours and azithromycin 500 mg q 24 hours ordered.  Height: 5\' 2"  (157.5 cm) Weight: 165 lb (74.8 kg) IBW/kg (Calculated) : 50.1  Temp (24hrs), Avg:97.8 F (36.6 C), Min:97.8 F (36.6 C), Max:97.8 F (36.6 C)   Recent Labs Lab 12/24/16 1932 12/24/16 2108  WBC 6.2  --   CREATININE  --  0.74  LATICACIDVEN 2.9*  --     Estimated Creatinine Clearance: 51.4 mL/min (by C-G formula based on SCr of 0.74 mg/dL).    No Known Allergies  Antimicrobials this admission: ceftriaxone azithromycin 5/30 >>    >>   Dose adjustments this admission:   Microbiology results: 5/30 BCx: pending 5/30 UCx: pending       5/30 UA: (-) 5/30 CXR: L basilar densities  Thank you for allowing pharmacy to be a part of this patient'Clements care.  Annette Clements 12/24/2016 10:17 PM

## 2016-12-24 NOTE — ED Notes (Signed)
Placed bandage over Iv on right arm so pt wont play with it or dislodge it.

## 2016-12-24 NOTE — ED Notes (Signed)
When attempting to scan barcode and give Rocephin it read as " Azithromycin"  instead of Rocephin. Pharmacy was called and made aware of this problem.

## 2016-12-24 NOTE — ED Triage Notes (Signed)
Pt presents to ED via [facility] from home c/o syncopal episode while in the shower. Per EMS patient son was giving patient a shower and reports pt lost conscious, denies falling or hitting head. Pt was sitting in a shower chair at the time. Upon EMS arrival pt was ranging 91% RA, placed on 4L continued to be at 91%. Increased [place] to 10 L and brought O2 level up to 100%. Pt given 500 cc of NS per EMS. Pt diaphoretic and clammy upon arrival, vomited x 1 PTA in EMS. Pt arriving at baseline, hx of dementia.

## 2016-12-24 NOTE — ED Notes (Signed)
Date and time results received: 12/24/16 2114 (use smartphrase ".now" to insert current time)  Test: lactic Critical Value: 2.9  Name of Provider Notified: Karma Greaser  Orders Received? Or Actions Taken?:

## 2016-12-24 NOTE — ED Notes (Signed)
Date and time results received: 12/24/16 2336 (use smartphrase ".now" to insert current time)  Test: lactic acid Critical Value: 2.8  Name of Provider Notified: Owens Shark  Orders Received? Or Actions Taken?:

## 2016-12-25 ENCOUNTER — Inpatient Hospital Stay: Payer: Medicare Other

## 2016-12-25 DIAGNOSIS — R112 Nausea with vomiting, unspecified: Secondary | ICD-10-CM

## 2016-12-25 DIAGNOSIS — E872 Acidosis, unspecified: Secondary | ICD-10-CM

## 2016-12-25 DIAGNOSIS — R7303 Prediabetes: Secondary | ICD-10-CM | POA: Insufficient documentation

## 2016-12-25 LAB — BASIC METABOLIC PANEL
ANION GAP: 4 — AB (ref 5–15)
BUN: 15 mg/dL (ref 6–20)
CHLORIDE: 108 mmol/L (ref 101–111)
CO2: 27 mmol/L (ref 22–32)
Calcium: 8.6 mg/dL — ABNORMAL LOW (ref 8.9–10.3)
Creatinine, Ser: 0.74 mg/dL (ref 0.44–1.00)
Glucose, Bld: 176 mg/dL — ABNORMAL HIGH (ref 65–99)
Potassium: 3.9 mmol/L (ref 3.5–5.1)
SODIUM: 139 mmol/L (ref 135–145)

## 2016-12-25 LAB — CBC
HEMATOCRIT: 35.5 % (ref 35.0–47.0)
HEMOGLOBIN: 11.5 g/dL — AB (ref 12.0–16.0)
MCH: 29.2 pg (ref 26.0–34.0)
MCHC: 32.6 g/dL (ref 32.0–36.0)
MCV: 89.8 fL (ref 80.0–100.0)
Platelets: 239 10*3/uL (ref 150–440)
RBC: 3.95 MIL/uL (ref 3.80–5.20)
RDW: 15 % — ABNORMAL HIGH (ref 11.5–14.5)
WBC: 7.5 10*3/uL (ref 3.6–11.0)

## 2016-12-25 LAB — TROPONIN I: Troponin I: <0.03 ng/mL (ref <0.03)

## 2016-12-25 LAB — LACTIC ACID, PLASMA: LACTIC ACID, VENOUS: 1 mmol/L (ref 0.5–1.9)

## 2016-12-25 MED ORDER — ACETAMINOPHEN 650 MG RE SUPP: 650.0000 mg | Freq: Four times a day (QID) | RECTAL | Status: DC | PRN

## 2016-12-25 MED ORDER — ASPIRIN EC 81 MG PO TBEC
81.0000 mg | DELAYED_RELEASE_TABLET | Freq: Every day | ORAL | Status: DC
  Administered 2016-12-25: 81 mg via ORAL
  Filled 2016-12-25 (×2): qty 1

## 2016-12-25 MED ORDER — PANTOPRAZOLE SODIUM 40 MG PO TBEC
40.0000 mg | DELAYED_RELEASE_TABLET | Freq: Every day | ORAL | Status: DC
  Administered 2016-12-25: 11:00:00 40 mg via ORAL
  Filled 2016-12-25 (×2): qty 1

## 2016-12-25 MED ORDER — QUETIAPINE FUMARATE 100 MG PO TABS
100.0000 mg | ORAL_TABLET | Freq: Every day | ORAL | Status: DC
  Administered 2016-12-25: 11:00:00 100 mg via ORAL
  Filled 2016-12-25 (×3): qty 1

## 2016-12-25 MED ORDER — VENLAFAXINE HCL ER 75 MG PO CP24
150.0000 mg | ORAL_CAPSULE | Freq: Every day | ORAL | Status: DC
  Administered 2016-12-25: 11:00:00 150 mg via ORAL
  Filled 2016-12-25 (×2): qty 2

## 2016-12-25 MED ORDER — PAROXETINE HCL 20 MG PO TABS
40.0000 mg | ORAL_TABLET | Freq: Every day | ORAL | Status: DC
  Administered 2016-12-25: 11:00:00 40 mg via ORAL
  Filled 2016-12-25 (×2): qty 2

## 2016-12-25 MED ORDER — AMOXICILLIN-POT CLAVULANATE 400-57 MG/5ML PO SUSR: 600.0000 mg | Freq: Two times a day (BID) | ORAL | 0 refills | Status: DC

## 2016-12-25 MED ORDER — BUSPIRONE HCL 5 MG PO TABS
5.0000 mg | ORAL_TABLET | Freq: Two times a day (BID) | ORAL | Status: DC
  Administered 2016-12-25 (×3): 5 mg via ORAL
  Filled 2016-12-25 (×4): qty 1

## 2016-12-25 MED ORDER — ONDANSETRON HCL 4 MG PO TABS: 4.0000 mg | ORAL_TABLET | Freq: Four times a day (QID) | ORAL | Status: DC | PRN

## 2016-12-25 MED ORDER — SODIUM CHLORIDE 0.9 % IV SOLN
INTRAVENOUS | Status: DC
Start: 2016-12-25 — End: 2016-12-25
  Administered 2016-12-25: 02:00:00 via INTRAVENOUS

## 2016-12-25 MED ORDER — ONDANSETRON HCL 4 MG PO TABS: 4.0000 mg | ORAL_TABLET | Freq: Four times a day (QID) | ORAL | 0 refills | Status: AC | PRN

## 2016-12-25 MED ORDER — AMOXICILLIN-POT CLAVULANATE 400-57 MG/5ML PO SUSR
600.0000 mg | Freq: Two times a day (BID) | ORAL | Status: DC
  Administered 2016-12-25: 11:00:00 600 mg via ORAL
  Filled 2016-12-25 (×4): qty 7.5

## 2016-12-25 MED ORDER — IPRATROPIUM-ALBUTEROL 0.5-2.5 (3) MG/3ML IN SOLN: 3.0000 mL | RESPIRATORY_TRACT | Status: DC | PRN

## 2016-12-25 MED ORDER — ACETAMINOPHEN 325 MG PO TABS: 650.0000 mg | ORAL_TABLET | Freq: Four times a day (QID) | ORAL | Status: DC | PRN

## 2016-12-25 MED ORDER — FAMOTIDINE 20 MG PO TABS
20.0000 mg | ORAL_TABLET | Freq: Every day | ORAL | Status: DC
  Administered 2016-12-25: 20 mg via ORAL
  Filled 2016-12-25 (×2): qty 1

## 2016-12-25 MED ORDER — ATORVASTATIN CALCIUM 20 MG PO TABS
20.0000 mg | ORAL_TABLET | Freq: Every evening | ORAL | Status: DC
  Administered 2016-12-25: 18:00:00 20 mg via ORAL
  Filled 2016-12-25: qty 1

## 2016-12-25 MED ORDER — POTASSIUM CHLORIDE IN NACL 20-0.9 MEQ/L-% IV SOLN
INTRAVENOUS | Status: DC
  Administered 2016-12-25 (×2): via INTRAVENOUS
  Filled 2016-12-25 (×5): qty 1000

## 2016-12-25 MED ORDER — ONDANSETRON HCL 4 MG/2ML IJ SOLN: 4.0000 mg | Freq: Four times a day (QID) | INTRAMUSCULAR | Status: DC | PRN

## 2016-12-25 MED ORDER — TRAZODONE HCL 50 MG PO TABS
25.0000 mg | ORAL_TABLET | Freq: Every day | ORAL | Status: DC
  Administered 2016-12-25: 02:00:00 25 mg via ORAL
  Filled 2016-12-25 (×2): qty 1

## 2016-12-25 MED ORDER — ENOXAPARIN SODIUM 40 MG/0.4ML ~~LOC~~ SOLN
40.0000 mg | SUBCUTANEOUS | Status: DC
  Filled 2016-12-25: qty 0.4

## 2016-12-25 NOTE — Evaluation (Signed)
Physical Therapy Evaluation Patient Details Name: Annette Clements MRN: [id number] DOB: 1932-12-01 Today's Date: 12/25/2016   History of Present Illness  83 y/o female here after syncopal episode while family was helping her shower, ultimately admitted with pneumonia.  She has significant dementia, also history of hypertension and reflux.  Clinical Impression  Pt did well with PT but showed good effort and was pleasant t/o the session despite being very confused.  She was able to ambulate around the nurses station w/o issue, her O2 stayed in the high 90s on room air and though she could not answer as to her baseline, she did not seem to need much assist at baseline and was functional here with PT exam.      Follow Up Recommendations No PT follow up    Equipment Recommendations       Recommendations for Other Services       Precautions / Restrictions Restrictions Weight Bearing Restrictions: No      Mobility  Bed Mobility Overal bed mobility:  (pt in recliner likely would not need any assist )                Transfers Overall transfer level: Independent Equipment used: None             General transfer comment: Pt able to rise with good confidence, no safety issues  Ambulation/Gait Ambulation/Gait assistance: Modified independent (Device/Increase time) Ambulation Distance (Feet): 200 Feet Assistive device: None       General Gait Details: Pt inconsistent only because of confusion and needing re-directed but ultimately she was safe and confident during ambulation.  She did occasionally use the rail, more for comfort than necessity.  Stairs            Wheelchair Mobility    Modified Rankin (Stroke Patients Only)       Balance Overall balance assessment: Independent                                           Pertinent Vitals/Pain Pain Assessment: No/denies pain    Home Living Family/patient expects to be discharged to::  Private residence Living Arrangements: Children Available Help at Discharge: Family;Available 24 hours/day   Home Access:  (pt unable to answer)              Prior Function Level of Independence:  (seems phyiscally she is capable but dementia is huge limiter)               Hand Dominance        Extremity/Trunk Assessment   Upper Extremity Assessment Upper Extremity Assessment: Overall WFL for tasks assessed    Lower Extremity Assessment Lower Extremity Assessment: Overall WFL for tasks assessed       Communication   Communication: No difficulties  Cognition Arousal/Alertness: Awake/alert Behavior During Therapy: Impulsive Overall Cognitive Status: History of cognitive impairments - at baseline                                 General Comments: Pt pleasantly confused t/o the session      General Comments      Exercises     Assessment/Plan    PT Assessment Patent does not need any further PT services  PT Problem List         PT  Treatment Interventions      PT Goals (Current goals can be found in the Care Plan section)  Acute Rehab PT Goals Patient Stated Goal: go home PT Goal Formulation: All assessment and education complete, DC therapy    Frequency     Barriers to discharge        Co-evaluation               AM-PAC PT "6 Clicks" Daily Activity  Outcome Measure Difficulty turning over in bed (including adjusting bedclothes, sheets and blankets)?: None Difficulty moving from lying on back to sitting on the side of the bed? : None Difficulty sitting down on and standing up from a chair with arms (e.g., wheelchair, bedside commode, etc,.)?: None Help needed moving to and from a bed to chair (including a wheelchair)?: None Help needed walking in hospital room?: None Help needed climbing 3-5 steps with a railing? : None 6 Click Score: 24    End of Session Equipment Utilized During Treatment: Gait belt Activity  Tolerance: Patient tolerated treatment well Patient left: in chair (tele-sitter in room ) Nurse Communication: Mobility status PT Visit Diagnosis: Difficulty in walking, not elsewhere classified (R26.2)    Time: 2620-3559 PT Time Calculation (min) (ACUTE ONLY): 17 min   Charges:   PT Evaluation $PT Eval Low Complexity: 1 Procedure     PT G CodesKreg Shropshire, DPT 12/25/2016, 12:24 PM

## 2016-12-25 NOTE — Care Management (Addendum)
Admitted to this facility with the diagnosis of pneumonia. Son lives with Monday-Friday 24/7 son is Evelena Peat [phone]). Aunt is in the home Saturday and Sunday 24/7. Last seen Dr. Netty Starring 09/17/16. Prescriptions are filled at [facility]. Home Health per [facility] last January 2018 per son. They are not in the home now. Son states that his mother was in a locked unit in [place] last summer. No home oxygen. The only medical equipment in the home is a shower chair. No falls. Appetite good. Self feed. Family helps with dressing and baths. Family will transport. Physical therapy evaluation completed. No Physical therapy follow-up needs. Ambulated 200 feet. Discussed with son at the bedside. Discussed that no services were recommended. States family wants [facility] like they had in January (OT,PT, Aide, [place]). Discussed that insurance would not pay for these services. Continued to say they need someone that could do personal care services. Discussed that insurance would not pay for this service. Offered personal care service list. States they couldn't pay for these services. Declined this information.  Shelbie Ammons RN MSN [facility] [phone]

## 2016-12-25 NOTE — Progress Notes (Signed)
[place] at [place] NAME: Annette Clements    MR#:  [id number]  DATE OF BIRTH:  04-11-1933  SUBJECTIVE:  CHIEF COMPLAINT:   Chief Complaint  Patient presents with  . Loss of Consciousness   The patient is a 83 year old Caucasian female with past medical history significant for history of dementia, hypertension, hyperlipidemia, anxiety, breast cancer, who presents to the hospital with complaints of syncopal episode, nausea, vomiting. On arrival to the hospital, she was noted to be hypoxic. Chest x-ray revealed pneumonia. Patient had no fever or leukocytosis. She was admitted to the hospital record. She was initiated on broad-spectrum antibiotic therapy. She denies any cough or phlegm production, denies any nausea, vomiting at present. No abdominal pain. Orthostatic vital signs were unremarkable. Patient was ambulated and no home health services were recommended. Echocardiogram and carotid ultrasound are still pending. The patient ate breakfast and lunch and had no recurrent nausea and vomiting.   Review of Systems  Unable to perform ROS: Dementia  Respiratory: Negative for cough and sputum production.   Gastrointestinal: Negative for abdominal pain.    VITAL SIGNS: Blood pressure 139/60, pulse 72, temperature 98 F (36.7 C), temperature source Oral, resp. rate 20, height 5\' 6"  (1.676 m), weight 71.9 kg (158 lb 8 oz), SpO2 97 %.  PHYSICAL EXAMINATION:   GENERAL:  83 y.o.-year-old patient lying in the bed with no acute distress.  EYES: Pupils equal, round, reactive to light and accommodation. No scleral icterus. Extraocular muscles intact.  HEENT: Head atraumatic, normocephalic. Oropharynx and nasopharynx clear.  NECK:  Supple, no jugular venous distention. No thyroid enlargement, no tenderness.  LUNGS: Normal breath sounds bilaterally, no wheezing, rales,rhonchi or crepitation. No use of accessory muscles of respiration.    CARDIOVASCULAR: S1, S2 normal. No murmurs, rubs, or gallops.  ABDOMEN: Soft, nontender, nondistended. Bowel sounds present. No organomegaly or mass.  EXTREMITIES: No pedal edema, cyanosis, or clubbing.  NEUROLOGIC: Cranial nerves II through XII are intact. Muscle strength 5/5 in all extremities. Sensation intact. Gait not checked.  PSYCHIATRIC: The patient is alert , demented, does not remember much.  SKIN: No obvious rash, lesion, or ulcer.   ORDERS/RESULTS REVIEWED:   CBC  Recent Labs Lab 12/24/16 1932 12/25/16 0049  WBC 6.2 7.5  HGB 12.0 11.5*  HCT 36.9 35.5  PLT 249 239  MCV 91.0 89.8  MCH 29.7 29.2  MCHC 32.7 32.6  RDW 14.8* 15.0*  LYMPHSABS 2.3  --   MONOABS 0.4  --   EOSABS 0.2  --   BASOSABS 0.0  --    ------------------------------------------------------------------------------------------------------------------  Chemistries   Recent Labs Lab 12/24/16 2108 12/25/16 0049  NA 140 139  K 3.9 3.9  CL 109 108  CO2 25 27  GLUCOSE 140* 176*  BUN 17 15  CREATININE 0.74 0.74  CALCIUM 8.8* 8.6*  MG 1.6*  --   AST 28  --   ALT 21  --   ALKPHOS 84  --   BILITOT 0.4  --    ------------------------------------------------------------------------------------------------------------------ estimated creatinine clearance is 55 mL/min (by C-G formula based on SCr of 0.74 mg/dL). ------------------------------------------------------------------------------------------------------------------ No results for input(s): TSH, T4TOTAL, T3FREE, THYROIDAB in the last 72 hours.  Invalid input(s): FREET3  Cardiac Enzymes  Recent Labs Lab 12/24/16 2108 12/25/16 0049 12/25/16 0647  TROPONINI <0.03 <0.03 <0.03   ------------------------------------------------------------------------------------------------------------------ Invalid input(s):  POCBNP ---------------------------------------------------------------------------------------------------------------  RADIOLOGY: Dg Chest Port 1 View  Result Date: 12/24/2016 CLINICAL DATA:  Syncopal episode.  Sepsis. EXAM: PORTABLE CHEST 1 VIEW COMPARISON:  04/29/2016 FINDINGS: Single view of the chest demonstrates increased densities at the left lung base. Negative for pneumothorax. Cardiomediastinal silhouette is prominent but may be accentuated by the portable AP technique. Bone structures appear to be intact. IMPRESSION: Left basilar densities are nonspecific. Findings could be related to atelectasis and possibly some airspace disease. This could be better characterized with a two view chest examination. Electronically Signed   By: Markus Daft M.D.   On: 12/24/2016 19:59    EKG:  Orders placed or performed during the hospital encounter of 12/24/16  . ED EKG  . ED EKG  . EKG 12-Lead  . EKG 12-Lead    ASSESSMENT AND PLAN:  Principal Problem:   CAP (community acquired pneumonia) Active Problems:   Dementia   Hypertension   Gastric reflux   Syncope   Nausea & vomiting   Lactic acidosis  #1, syncope of unclear etiology at this time, unless otherwise signs were unremarkable, echocardiogram, carotid ultrasound is pending. Telemetry revealed no significant abnormalities. Cardiac enzymes were normal.  Patient was seen by physical therapist in no physical therapy follow-up was recommended on discharge. Possible discharge home today #2. Nausea, vomiting, resolved, patient is to continue regular diet #3. Pneumonia, questionable aspiration pneumonitis, normal oxygenation, no elevated white blood cell count or fevers, continue patient on Augmentin for 7 days to complete a course #4. Lactic acidosis, resolved with IV fluid administration #5. Hypomagnesemia, replenished intravenously #6. Hyperglycemia, get hemoglobin A1c to rule out diabetes   Management plans discussed with the patient,  family and they are in agreement.   DRUG ALLERGIES:  Allergies  Allergen Reactions  . Atenolol-Chlorthalidone Other (See Comments)    bradycardia  . Chlorzoxazone Other (See Comments)  . Codeine Other (See Comments)  . Fluvastatin Other (See Comments)  . Indapamide Other (See Comments)  . Levofloxacin Other (See Comments)  . Propoxyphene Other (See Comments)  . Verapamil Other (See Comments)    bradycardia    CODE STATUS:     Code Status Orders        Start     Ordered   12/25/16 0046  Do not attempt resuscitation (DNR)  Continuous    Question Answer Comment  In the event of cardiac or respiratory ARREST Do not call a "code blue"   In the event of cardiac or respiratory ARREST Do not perform Intubation, CPR, defibrillation or ACLS   In the event of cardiac or respiratory ARREST Use medication by any route, position, wound care, and other measures to relive pain and suffering. May use oxygen, suction and manual treatment of airway obstruction as needed for comfort.      12/25/16 0046    Code Status History    Date Active Date Inactive Code Status Order ID Comments User Context   12/24/2016  7:42 PM 12/25/2016 12:46 AM DNR [id number]  Hinda Kehr, MD ED    Advance Directive Documentation     Most Recent Value  Type of Advance Directive  Out of facility DNR (pink MOST or yellow form)  Pre-existing out of facility DNR order (yellow form or pink MOST form)  Yellow form placed in chart (order not valid for inpatient use)  "MOST" Form in Place?  -      TOTAL TIME TAKING CARE OF THIS PATIENT: 40 minutes.    Theodoro Grist M.D on 12/25/2016 at 1:49 PM  Between 7am to 6pm - Pager - [phone]  After 6pm go to [email or web address] -  password [email or web address] [facility]  [place] [facility]  Office  [phone]  CC: Primary care physician; Dion Body, MD

## 2016-12-26 ENCOUNTER — Inpatient Hospital Stay: Payer: Medicare Other

## 2016-12-26 ENCOUNTER — Inpatient Hospital Stay
Admit: 2016-12-26 | Discharge: 2016-12-26 | Disposition: A | Discharge status: Home / Self Care | Payer: Medicare Other | Attending: Internal Medicine | Admitting: Internal Medicine

## 2016-12-26 LAB — BLOOD CULTURE ID PANEL (REFLEXED)
Acinetobacter baumannii: NOT DETECTED
Candida albicans: NOT DETECTED
Candida glabrata: NOT DETECTED
Candida krusei: NOT DETECTED
Candida parapsilosis: NOT DETECTED
Candida tropicalis: NOT DETECTED
ENTEROCOCCUS SPECIES: NOT DETECTED
ESCHERICHIA COLI: NOT DETECTED
Enterobacter cloacae complex: NOT DETECTED
Enterobacteriaceae species: NOT DETECTED
HAEMOPHILUS INFLUENZAE: NOT DETECTED
Klebsiella oxytoca: NOT DETECTED
Klebsiella pneumoniae: NOT DETECTED
LISTERIA MONOCYTOGENES: NOT DETECTED
Neisseria meningitidis: NOT DETECTED
PSEUDOMONAS AERUGINOSA: NOT DETECTED
Proteus species: NOT DETECTED
SERRATIA MARCESCENS: NOT DETECTED
STAPHYLOCOCCUS AUREUS BCID: NOT DETECTED
STREPTOCOCCUS AGALACTIAE: NOT DETECTED
STREPTOCOCCUS PNEUMONIAE: NOT DETECTED
STREPTOCOCCUS PYOGENES: NOT DETECTED
STREPTOCOCCUS SPECIES: DETECTED — AB
Staphylococcus species: NOT DETECTED

## 2016-12-26 LAB — ECHOCARDIOGRAM COMPLETE
HEIGHTINCHES: 66 in
Weight: 2536 oz

## 2016-12-26 LAB — URINE CULTURE: Culture: NO GROWTH

## 2016-12-26 LAB — HEMOGLOBIN A1C
Hgb A1c MFr Bld: 6.4 % — ABNORMAL HIGH (ref 4.8–5.6)
Mean Plasma Glucose: 137 mg/dL

## 2016-12-26 NOTE — Progress Notes (Signed)
PHARMACY - PHYSICIAN COMMUNICATION CRITICAL VALUE ALERT - BLOOD CULTURE IDENTIFICATION (BCID)  Results for orders placed or performed during the hospital encounter of 12/24/16  Blood Culture ID Panel (Reflexed) (Collected: 12/24/2016  7:41 PM)  Result Value Ref Range   Enterococcus species NOT DETECTED NOT DETECTED   Listeria monocytogenes NOT DETECTED NOT DETECTED   Staphylococcus species NOT DETECTED NOT DETECTED   Staphylococcus aureus NOT DETECTED NOT DETECTED   Streptococcus species DETECTED (A) NOT DETECTED   Streptococcus agalactiae NOT DETECTED NOT DETECTED   Streptococcus pneumoniae NOT DETECTED NOT DETECTED   Streptococcus pyogenes NOT DETECTED NOT DETECTED   Acinetobacter baumannii NOT DETECTED NOT DETECTED   Enterobacteriaceae species NOT DETECTED NOT DETECTED   Enterobacter cloacae complex NOT DETECTED NOT DETECTED   Escherichia coli NOT DETECTED NOT DETECTED   Klebsiella oxytoca NOT DETECTED NOT DETECTED   Klebsiella pneumoniae NOT DETECTED NOT DETECTED   Proteus species NOT DETECTED NOT DETECTED   Serratia marcescens NOT DETECTED NOT DETECTED   Haemophilus influenzae NOT DETECTED NOT DETECTED   Neisseria meningitidis NOT DETECTED NOT DETECTED   Pseudomonas aeruginosa NOT DETECTED NOT DETECTED   Candida albicans NOT DETECTED NOT DETECTED   Candida glabrata NOT DETECTED NOT DETECTED   Candida krusei NOT DETECTED NOT DETECTED   Candida parapsilosis NOT DETECTED NOT DETECTED   Candida tropicalis NOT DETECTED NOT DETECTED    Name of physician (or Provider) Contacted: [facility]  Changes to prescribed antibiotics required: n/a  Kivon Aprea S 12/26/2016  5:23 AM

## 2016-12-26 NOTE — Progress Notes (Signed)
*  PRELIMINARY RESULTS* Echocardiogram 2D Echocardiogram has been performed.  Annette Clements 12/26/2016, 1:51 PM

## 2016-12-26 NOTE — Discharge Summary (Signed)
[place] [facility] [place] NAME: Annette Clements    MR#:  [id number]  DATE OF BIRTH:  1933-02-24  DATE OF ADMISSION:  12/24/2016 ADMITTING PHYSICIAN: Lance Coon, MD  DATE OF DISCHARGE: 12/26/2016  4:45 PM  PRIMARY CARE PHYSICIAN: Dion Body, MD     ADMISSION DIAGNOSIS:  DNR (do not resuscitate) [Z66] Elevated lactic acid level [R79.89] Sepsis, due to unspecified organism ([place]) [A41.9] Hypotension, unspecified hypotension type [I95.9] Syncope, unspecified syncope type [R55] Vomiting, intractability of vomiting not specified, presence of nausea not specified, unspecified vomiting type [R11.10] Dementia without behavioral disturbance, unspecified dementia type [F03.90]  DISCHARGE DIAGNOSIS:  Principal Problem:   Syncope Active Problems:   Dementia   Hypertension   Gastric reflux   Nausea & vomiting   Lactic acidosis   Prediabetes   SECONDARY DIAGNOSIS:   Past Medical History:  Diagnosis Date  . Anxiety   . Breast cancer ([place]) 09/2012   Right breast cancer - chemotherapy & lumpectomy  . Dementia   . GERD (gastroesophageal reflux disease)   . HLD (hyperlipidemia)   . HTN (hypertension)     .pro HOSPITAL COURSE:   The patient is a 83 year old Caucasian female with past medical history significant for history of dementia, hypertension, hyperlipidemia, anxiety, breast cancer, who presents to the hospital with complaints of syncopal episode, nausea, vomiting. On arrival to the hospital, she was noted to be hypoxic. Chest x-ray revealed pneumonia. Patient had no fever or leukocytosis. She was admitted to the hospital ,  initiated on broad-spectrum antibiotic therapy. She denied any cough or phlegm production. Repeated chest x-ray failed to show pneumonia, antibiotic was stopped. Orthostatic vital signs were unremarkable. Patient was ambulated and no home health services were recommended. Echocardiogram was normal and  carotid ultrasound revealed no hemodynamically significant stenosis. Patient's hemoglobin A1c was found to be 6.4, she was considered to be prediabetic. Diabetic diet was recommended. Patient was felt to be stable to be discharged home today Discussion by problem:. #1, syncope , suspected vasovagal versus hypovolemic,  echocardiogram, carotid ultrasound were unremarkable. Telemetry revealed no significant abnormalities. Cardiac enzymes were normal.  Patient was seen by physical therapist in no physical therapy follow-up was recommended on discharge. Discharge her home today #2. Nausea, vomiting, resolved, patient is to continue regular consistency diet #3. questionable aspiration pneumonitis, repeat the chest x-ray showed no pneumonia, antibiotic is stopped, patient had normal oxygenation, no elevated white blood cell count or fevers, suspect initial chest x-ray revealed just atelectasis. #4. Lactic acidosis, resolved with IV fluid administration, could be related to volume depletion due to prediabetes. #5. Hypomagnesemia, replenished intravenously #6. Hyperglycemia, prediabetes, hemoglobin A1c was 6.4, diabetic diet was recommended, follow-up with primary care physician for further recommendations DISCHARGE CONDITIONS:   Stable  CONSULTS OBTAINED:    DRUG ALLERGIES:   Allergies  Allergen Reactions  . Atenolol-Chlorthalidone Other (See Comments)    bradycardia  . Chlorzoxazone Other (See Comments)  . Codeine Other (See Comments)  . Fluvastatin Other (See Comments)  . Indapamide Other (See Comments)  . Levofloxacin Other (See Comments)  . Propoxyphene Other (See Comments)  . Verapamil Other (See Comments)    bradycardia    DISCHARGE MEDICATIONS:   Discharge Medication List as of 12/26/2016  4:20 PM    START taking these medications   Details  ondansetron (ZOFRAN) 4 MG tablet Take 1 tablet (4 mg total) by mouth every 6 (six) hours as needed for nausea., Starting Thu 12/25/2016,  Normal       CONTINUE these medications which have NOT CHANGED   Details  acetaminophen (TYLENOL) 500 MG tablet Take 1,000 mg by mouth every 8 (eight) hours as needed., Historical Med    aspirin EC 81 MG tablet Take 81 mg by mouth daily., Historical Med    atorvastatin (LIPITOR) 20 MG tablet Take 20 mg by mouth every evening., Starting Tue 03/18/2016, Historical Med    glimepiride (AMARYL) 2 MG tablet Take 1 tablet by mouth daily., Starting Mon 12/01/2016, Historical Med    lisinopril (PRINIVIL,ZESTRIL) 40 MG tablet Take 40 mg by mouth daily., Starting Tue 03/18/2016, Historical Med    omeprazole (PRILOSEC) 20 MG capsule Take 20 mg by mouth daily., Starting Wed 02/20/2016, Historical Med    QUEtiapine (SEROQUEL) 100 MG tablet Take 1 tablet by mouth daily., Starting Mon 12/01/2016, Historical Med    traZODone (DESYREL) 50 MG tablet Take 0.5 tablets by mouth at bedtime., Starting Thu 11/27/2016, Historical Med    venlafaxine XR (EFFEXOR-XR) 150 MG 24 hr capsule Take 1 capsule by mouth daily., Starting Thu 11/27/2016, Historical Med      STOP taking these medications     amoxicillin-clavulanate (AUGMENTIN) 400-57 MG/5ML suspension      busPIRone (BUSPAR) 5 MG tablet      meloxicam (MOBIC) 7.5 MG tablet      PARoxetine (PAXIL) 40 MG tablet      ranitidine (ZANTAC) 150 MG tablet          DISCHARGE INSTRUCTIONS:    The patient is to follow-up with primary care physician within one week after discharge  If you experience worsening of your admission symptoms, develop shortness of breath, life threatening emergency, suicidal or homicidal thoughts you must seek medical attention immediately by calling 911 or calling your MD immediately  if symptoms less severe.  You Must read complete instructions/literature along with all the possible adverse reactions/side effects for all the Medicines you take and that have been prescribed to you. Take any new Medicines after you have completely understood and  accept all the possible adverse reactions/side effects.   Please note  You were cared for by a hospitalist during your hospital stay. If you have any questions about your discharge medications or the care you received while you were in the hospital after you are discharged, you can call the unit and asked to speak with the hospitalist on call if the hospitalist that took care of you is not available. Once you are discharged, your primary care physician will handle any further medical issues. Please note that NO REFILLS for any discharge medications will be authorized once you are discharged, as it is imperative that you return to your primary care physician (or establish a relationship with a primary care physician if you do not have one) for your aftercare needs so that they can reassess your need for medications and monitor your lab values.    Today   CHIEF COMPLAINT:   Chief Complaint  Patient presents with  . Loss of Consciousness    HISTORY OF PRESENT ILLNESS:     VITAL SIGNS:  Blood pressure (!) 99/56, pulse 78, temperature 98.2 F (36.8 C), temperature source Oral, resp. rate 20, height 5\' 6"  (1.676 m), weight 71.9 kg (158 lb 8 oz), SpO2 100 %.  I/O:   Intake/Output Summary (Last 24 hours) at 12/26/16 1754 Last data filed at 12/26/16 0300  Gross per 24 hour  Intake  840 ml  Output                0 ml  Net              840 ml    PHYSICAL EXAMINATION:  GENERAL:  83 y.o.-year-old patient lying in the bed with no acute distress.  EYES: Pupils equal, round, reactive to light and accommodation. No scleral icterus. Extraocular muscles intact.  HEENT: Head atraumatic, normocephalic. Oropharynx and nasopharynx clear.  NECK:  Supple, no jugular venous distention. No thyroid enlargement, no tenderness.  LUNGS: Normal breath sounds bilaterally, no wheezing, rales,rhonchi or crepitation. No use of accessory muscles of respiration.  CARDIOVASCULAR: S1, S2 normal. No  murmurs, rubs, or gallops.  ABDOMEN: Soft, non-tender, non-distended. Bowel sounds present. No organomegaly or mass.  EXTREMITIES: No pedal edema, cyanosis, or clubbing.  NEUROLOGIC: Cranial nerves II through XII are intact. Muscle strength 5/5 in all extremities. Sensation intact. Gait not checked.  PSYCHIATRIC: The patient is alert and oriented x 3.  SKIN: No obvious rash, lesion, or ulcer.   DATA REVIEW:   CBC  Recent Labs Lab 12/25/16 0049  WBC 7.5  HGB 11.5*  HCT 35.5  PLT 239    Chemistries   Recent Labs Lab 12/24/16 2108 12/25/16 0049  NA 140 139  K 3.9 3.9  CL 109 108  CO2 25 27  GLUCOSE 140* 176*  BUN 17 15  CREATININE 0.74 0.74  CALCIUM 8.8* 8.6*  MG 1.6*  --   AST 28  --   ALT 21  --   ALKPHOS 84  --   BILITOT 0.4  --     Cardiac Enzymes  Recent Labs Lab 12/25/16 1318  TROPONINI <0.03    Microbiology Results  Results for orders placed or performed during the hospital encounter of 12/24/16  Urine culture     Status: None   Collection Time: 12/24/16  7:37 PM  Result Value Ref Range Status   Specimen Description URINE, RANDOM  Final   Special Requests NONE  Final   Culture   Final    NO GROWTH Performed at [facility], [place] [address], [place], [place] [address]    Report Status 12/26/2016 FINAL  Final  Blood Culture (routine x 2)     Status: None (Preliminary result)   Collection Time: 12/24/16  7:41 PM  Result Value Ref Range Status   Specimen Description BLOOD R AC  Final   Special Requests BOTTLES DRAWN AEROBIC AND ANAEROBIC BCAV  Final   Culture  Setup Time   Final    GRAM POSITIVE COCCI ANAEROBIC BOTTLE ONLY CRITICAL RESULT CALLED TO, READ BACK BY AND VERIFIED WITH: MATT MCBANE @ 1914 ON 12/26/2016 BY CAF    Culture GRAM POSITIVE COCCI  Final   Report Status PENDING  Incomplete  Blood Culture ID Panel (Reflexed)     Status: Abnormal   Collection Time: 12/24/16  7:41 PM  Result Value Ref Range Status   Enterococcus species  NOT DETECTED NOT DETECTED Final   Listeria monocytogenes NOT DETECTED NOT DETECTED Final   Staphylococcus species NOT DETECTED NOT DETECTED Final   Staphylococcus aureus NOT DETECTED NOT DETECTED Final   Streptococcus species DETECTED (A) NOT DETECTED Final    Comment: Not Enterococcus species, Streptococcus agalactiae, Streptococcus pyogenes, or Streptococcus pneumoniae. CRITICAL RESULT CALLED TO, READ BACK BY AND VERIFIED WITH: MATT MCBANE @ 0120 ON 12/26/2016 BY CAF    Streptococcus agalactiae NOT DETECTED NOT DETECTED Final  Streptococcus pneumoniae NOT DETECTED NOT DETECTED Final   Streptococcus pyogenes NOT DETECTED NOT DETECTED Final   Acinetobacter baumannii NOT DETECTED NOT DETECTED Final   Enterobacteriaceae species NOT DETECTED NOT DETECTED Final   Enterobacter cloacae complex NOT DETECTED NOT DETECTED Final   Escherichia coli NOT DETECTED NOT DETECTED Final   Klebsiella oxytoca NOT DETECTED NOT DETECTED Final   Klebsiella pneumoniae NOT DETECTED NOT DETECTED Final   Proteus species NOT DETECTED NOT DETECTED Final   Serratia marcescens NOT DETECTED NOT DETECTED Final   Haemophilus influenzae NOT DETECTED NOT DETECTED Final   Neisseria meningitidis NOT DETECTED NOT DETECTED Final   Pseudomonas aeruginosa NOT DETECTED NOT DETECTED Final   Candida albicans NOT DETECTED NOT DETECTED Final   Candida glabrata NOT DETECTED NOT DETECTED Final   Candida krusei NOT DETECTED NOT DETECTED Final   Candida parapsilosis NOT DETECTED NOT DETECTED Final   Candida tropicalis NOT DETECTED NOT DETECTED Final  Blood Culture (routine x 2)     Status: None (Preliminary result)   Collection Time: 12/24/16  8:18 PM  Result Value Ref Range Status   Specimen Description BLOOD BLOOD RIGHT ARM  Final   Special Requests   Final    BOTTLES DRAWN AEROBIC AND ANAEROBIC Blood Culture adequate volume   Culture NO GROWTH 2 DAYS  Final   Report Status PENDING  Incomplete    RADIOLOGY:  Dg Chest 2  View  Result Date: 12/26/2016 CLINICAL DATA:  Recent pneumonia. Personal history of breast cancer. EXAM: CHEST  2 VIEW COMPARISON:  One-view chest x-ray 12/24/2016 FINDINGS: The heart is enlarged. Atherosclerotic change at the aortic arch. Retrocardiac aeration is improved. No significant airspace consolidation is present. Chronic elevation of the right hemidiaphragm is again noted. There is slight blunting of the CP angles bilaterally. There is some fluid with thickening of the minor fissure. No significant edema is present. Degenerative changes are again noted in the shoulders bilaterally. Exaggerated thoracic kyphosis is noted. IMPRESSION: 1. No significant airspace consolidation or pneumonia. 2. Small pleural effusions may be present. 3. Cardiomegaly without edema. 4. Aortic atherosclerosis. Electronically Signed   By: San Morelle M.D.   On: 12/26/2016 10:17   US Carotid Bilateral  Result Date: 12/25/2016 CLINICAL DATA:  83 year old female with a history of prior TIA. Cardiovascular factors include hypertension, hyperlipidemia. EXAM: BILATERAL CAROTID DUPLEX ULTRASOUND TECHNIQUE: Pearline Cables scale imaging, color Doppler and duplex ultrasound were performed of bilateral carotid and vertebral arteries in the neck. COMPARISON:  No prior duplex FINDINGS: Criteria: Quantification of carotid stenosis is based on velocity parameters that correlate the residual internal carotid diameter with NASCET-based stenosis levels, using the diameter of the distal internal carotid lumen as the denominator for stenosis measurement. The following velocity measurements were obtained: RIGHT ICA:  Systolic 70 cm/sec, Diastolic 18 cm/sec CCA:  96 cm/sec SYSTOLIC ICA/CCA RATIO:  0.5 ECA:  114 cm/sec LEFT ICA:  Systolic 49 cm/sec, Diastolic 7 cm/sec CCA:  82 cm/sec SYSTOLIC ICA/CCA RATIO:  0.9 ECA:  202 cm/sec Right Brachial SBP: Not acquired Left Brachial SBP: Not acquired RIGHT CAROTID ARTERY: No significant calcifications of the  right common carotid artery. Intermediate waveform maintained. Heterogeneous plaque at the right carotid bifurcation. No significant lumen shadowing. Low resistance waveform of the right ICA. No significant tortuosity. RIGHT VERTEBRAL ARTERY: Antegrade flow with low resistance waveform. LEFT CAROTID ARTERY: No significant calcifications of the left common carotid artery. Intermediate waveform maintained. Heterogeneous and partially calcified plaque at the left carotid bifurcation without significant lumen shadowing.  Low resistance waveform of the left ICA. No significant tortuosity. LEFT VERTEBRAL ARTERY:  Antegrade flow with low resistance waveform. IMPRESSION: Color duplex indicates minimal heterogeneous and calcified plaque, with no hemodynamically significant stenosis by duplex criteria in the extracranial cerebrovascular circulation. Signed, Dulcy Fanny. Earleen Newport, DO [facility] Rad[facility] Electronically Signed   By: Corrie Mckusick D.O.   On: 12/25/2016 15:17   Dg Chest Port 1 View  Result Date: 12/24/2016 CLINICAL DATA:  Syncopal episode.  Sepsis. EXAM: PORTABLE CHEST 1 VIEW COMPARISON:  04/29/2016 FINDINGS: Single view of the chest demonstrates increased densities at the left lung base. Negative for pneumothorax. Cardiomediastinal silhouette is prominent but may be accentuated by the portable AP technique. Bone structures appear to be intact. IMPRESSION: Left basilar densities are nonspecific. Findings could be related to atelectasis and possibly some airspace disease. This could be better characterized with a two view chest examination. Electronically Signed   By: Markus Daft M.D.   On: 12/24/2016 19:59    EKG:   Orders placed or performed during the hospital encounter of 12/24/16  . ED EKG  . ED EKG  . EKG 12-Lead  . EKG 12-Lead      Management plans discussed with the patient, family and they are in agreement.  CODE STATUS:     Code Status  Orders        Start     Ordered   12/25/16 0046  Do not attempt resuscitation (DNR)  Continuous    Question Answer Comment  In the event of cardiac or respiratory ARREST Do not call a "code blue"   In the event of cardiac or respiratory ARREST Do not perform Intubation, CPR, defibrillation or ACLS   In the event of cardiac or respiratory ARREST Use medication by any route, position, wound care, and other measures to relive pain and suffering. May use oxygen, suction and manual treatment of airway obstruction as needed for comfort.      12/25/16 0046    Code Status History    Date Active Date Inactive Code Status Order ID Comments User Context   12/24/2016  7:42 PM 12/25/2016 12:46 AM DNR [id number]  Hinda Kehr, MD ED    Advance Directive Documentation     Most Recent Value  Type of Advance Directive  Out of facility DNR (pink MOST or yellow form)  Pre-existing out of facility DNR order (yellow form or pink MOST form)  Yellow form placed in chart (order not valid for inpatient use)  "MOST" Form in Place?  -      TOTAL TIME TAKING CARE OF THIS PATIENT: 40 minutes.    Theodoro Grist M.D on 12/26/2016 at 5:54 PM  Between 7am to 6pm - Pager - [phone]  After 6pm go to [email or web address] - password [email or web address] [facility]  Office  [phone]  CC: Primary care physician; Dion Body, MD

## 2016-12-26 NOTE — Progress Notes (Signed)
Patient discharged home with son via w/c. Discharge instructions reviewed. Diet and medications explained. Son denied questions. IV removed without complications or complaints. Belongings collected by son.

## 2016-12-27 LAB — CULTURE, BLOOD (ROUTINE X 2)

## 2016-12-29 LAB — CULTURE, BLOOD (ROUTINE X 2)
CULTURE: NO GROWTH
SPECIAL REQUESTS: ADEQUATE

## 2017-02-05 ENCOUNTER — Emergency Department
Admission: EM | Admit: 2017-02-05 | Discharge: 2017-02-05 | Disposition: A | Discharge status: Home / Self Care | Payer: Medicare Other | Attending: Emergency Medicine | Admitting: Emergency Medicine

## 2017-02-05 DIAGNOSIS — W19XXXA Unspecified fall, initial encounter: Secondary | ICD-10-CM

## 2017-02-05 DIAGNOSIS — Z79899 Other long term (current) drug therapy: Secondary | ICD-10-CM | POA: Insufficient documentation

## 2017-02-05 DIAGNOSIS — I10 Essential (primary) hypertension: Secondary | ICD-10-CM | POA: Insufficient documentation

## 2017-02-05 DIAGNOSIS — Y939 Activity, unspecified: Secondary | ICD-10-CM | POA: Insufficient documentation

## 2017-02-05 DIAGNOSIS — E119 Type 2 diabetes mellitus without complications: Secondary | ICD-10-CM | POA: Insufficient documentation

## 2017-02-05 DIAGNOSIS — Z7984 Long term (current) use of oral hypoglycemic drugs: Secondary | ICD-10-CM | POA: Insufficient documentation

## 2017-02-05 DIAGNOSIS — Z7982 Long term (current) use of aspirin: Secondary | ICD-10-CM | POA: Insufficient documentation

## 2017-02-05 DIAGNOSIS — Y929 Unspecified place or not applicable: Secondary | ICD-10-CM | POA: Diagnosis not present

## 2017-02-05 DIAGNOSIS — Y999 Unspecified external cause status: Secondary | ICD-10-CM | POA: Insufficient documentation

## 2017-02-05 DIAGNOSIS — Z043 Encounter for examination and observation following other accident: Secondary | ICD-10-CM | POA: Diagnosis present

## 2017-02-05 DIAGNOSIS — F0391 Unspecified dementia with behavioral disturbance: Secondary | ICD-10-CM

## 2017-02-05 HISTORY — DX: Type 2 diabetes mellitus without complications: E11.9

## 2017-02-05 LAB — CBC WITH DIFFERENTIAL/PLATELET
BASOS ABS: 0 10*3/uL (ref 0–0.1)
Basophils Relative: 0 %
EOS ABS: 0 10*3/uL (ref 0–0.7)
Eosinophils Relative: 0 %
HCT: 37.4 % (ref 35.0–47.0)
HEMOGLOBIN: 12.2 g/dL (ref 12.0–16.0)
LYMPHS ABS: 0.9 10*3/uL — AB (ref 1.0–3.6)
LYMPHS PCT: 9 %
MCH: 28.6 pg (ref 26.0–34.0)
MCHC: 32.6 g/dL (ref 32.0–36.0)
MCV: 87.9 fL (ref 80.0–100.0)
Monocytes Absolute: 0.3 10*3/uL (ref 0.2–0.9)
Monocytes Relative: 3 %
NEUTROS PCT: 88 %
Neutro Abs: 8.6 10*3/uL — ABNORMAL HIGH (ref 1.4–6.5)
Platelets: 290 10*3/uL (ref 150–440)
RBC: 4.26 MIL/uL (ref 3.80–5.20)
RDW: 15.4 % — ABNORMAL HIGH (ref 11.5–14.5)
WBC: 9.8 10*3/uL (ref 3.6–11.0)

## 2017-02-05 LAB — HEPATIC FUNCTION PANEL
ALBUMIN: 4 g/dL (ref 3.5–5.0)
ALT: 32 U/L (ref 14–54)
AST: 38 U/L (ref 15–41)
Alkaline Phosphatase: 90 U/L (ref 38–126)
Bilirubin, Direct: <0.1 mg/dL — ABNORMAL LOW (ref 0.1–0.5)
Total Bilirubin: 0.4 mg/dL (ref 0.3–1.2)
Total Protein: 7.4 g/dL (ref 6.5–8.1)

## 2017-02-05 LAB — BASIC METABOLIC PANEL
ANION GAP: 8 (ref 5–15)
BUN: 20 mg/dL (ref 6–20)
CHLORIDE: 104 mmol/L (ref 101–111)
CO2: 28 mmol/L (ref 22–32)
Calcium: 9.6 mg/dL (ref 8.9–10.3)
Creatinine, Ser: 0.59 mg/dL (ref 0.44–1.00)
GFR calc Af Amer: >60 mL/min (ref >60)
Glucose, Bld: 166 mg/dL — ABNORMAL HIGH (ref 65–99)
POTASSIUM: 4 mmol/L (ref 3.5–5.1)
Sodium: 140 mmol/L (ref 135–145)

## 2017-02-05 LAB — URINALYSIS, COMPLETE (UACMP) WITH MICROSCOPIC
BACTERIA UA: NONE SEEN
Bilirubin Urine: NEGATIVE
Glucose, UA: >=500 mg/dL — AB
Ketones, ur: NEGATIVE mg/dL
Leukocytes, UA: NEGATIVE
Nitrite: NEGATIVE
PROTEIN: 30 mg/dL — AB
Specific Gravity, Urine: 1.02 (ref 1.005–1.030)
pH: 6 (ref 5.0–8.0)

## 2017-02-05 LAB — TROPONIN I: Troponin I: <0.03 ng/mL (ref <0.03)

## 2017-02-05 NOTE — ED Provider Notes (Signed)
[facility] Emergency Department Provider Note  ____________________________________________   First MD Initiated Contact with Patient 02/05/17 [phone]     (approximate)  I have reviewed the triage vital signs and the nursing notes.   HISTORY  Chief Complaint Fall  Level V exemption history Limited by the patient's dementia  HPI Annette Clements is a 83 y.o. female who comes to the emergency department because apparently she had an unwitnessed fall at home. The patient is profoundly demented and does not know that she is in the hospital, does not know why she is in the hospital when I redirect her, and thinks it is 1934.   Past Medical History:  Diagnosis Date  . Anxiety   . Breast cancer ([place]) 09/2012   Right breast cancer - chemotherapy & lumpectomy  . Dementia   . Diabetes mellitus without complication ([place])   . GERD (gastroesophageal reflux disease)   . HLD (hyperlipidemia)   . HTN (hypertension)     Patient Active Problem List   Diagnosis Date Noted  . Nausea & vomiting 12/25/2016  . Lactic acidosis 12/25/2016  . Prediabetes 12/25/2016  . Syncope 12/24/2016  . Dementia 04/30/2016  . Hypertension 04/30/2016  . Gastric reflux 04/30/2016    Past Surgical History:  Procedure Laterality Date  . BREAST LUMPECTOMY Right 09/2012    Prior to Admission medications   Medication Sig Start Date End Date Taking? Authorizing Provider  aspirin EC 81 MG tablet Take 81 mg by mouth daily.   Yes [provider]  atorvastatin (LIPITOR) 20 MG tablet Take 20 mg by mouth every evening. 03/18/16  Yes [provider]  glimepiride (AMARYL) 2 MG tablet Take 1 tablet by mouth daily. 12/01/16  Yes [provider]  omeprazole (PRILOSEC) 20 MG capsule Take 20 mg by mouth daily. 02/20/16  Yes [provider]  QUEtiapine (SEROQUEL) 100 MG tablet Take 1 tablet by mouth daily. 12/01/16  Yes [provider]  venlafaxine XR  (EFFEXOR-XR) 150 MG 24 hr capsule Take 1 capsule by mouth daily. 11/27/16  Yes [provider]  acetaminophen (TYLENOL) 500 MG tablet Take 1,000 mg by mouth every 8 (eight) hours as needed.    [provider]  ondansetron (ZOFRAN) 4 MG tablet Take 1 tablet (4 mg total) by mouth every 6 (six) hours as needed for nausea. 12/25/16   Theodoro Grist, MD    Allergies Atenolol-chlorthalidone; Chlorzoxazone; Codeine; Fluvastatin; Indapamide; Levofloxacin; Propoxyphene; and Verapamil  Family History  Problem Relation Age of Onset  . Cancer Sister     Social History Social History  Substance Use Topics  . Smoking status: Never Smoker  . Smokeless tobacco: Never Used  . Alcohol use No    Review of Systems Level V exemption history Limited by the patient's dementia ____________________________________________   PHYSICAL EXAM:  VITAL SIGNS: ED Triage Vitals  Enc Vitals Group     BP --      Pulse Rate 02/05/17 0616 74     Resp 02/05/17 0616 (!) 22     Temp 02/05/17 0616 98.9 F (37.2 C)     Temp Source 02/05/17 0616 Oral     SpO2 02/05/17 0616 95 %     Weight 02/05/17 0610 145 lb (65.8 kg)     Height 02/05/17 0610 5\' 2"  (1.575 m)     Head Circumference --      Peak Flow --      Pain Score --  Pain Loc --      Pain Edu? --      Excl. in [place]? --     Constitutional: Pleasant cooperative alert and oriented to her name only she thinks is 1934 no acute distress Eyes: PERRL EOMI. Head: Atraumatic. Nose: No congestion/rhinnorhea. Mouth/Throat: No trismus Neck: No stridor.   Cardiovascular: Normal rate, regular rhythm. Grossly normal heart sounds.  Good peripheral circulation. Respiratory: Normal respiratory effort.  No retractions. Lungs CTAB and moving good air Gastrointestinal: Soft nontender Musculoskeletal: No lower extremity edema   Neurologic:   No gross focal neurologic deficits are appreciated. Skin:  Skin is warm, dry and intact. No rash  noted. Psychiatric: Severe dementia    ____________________________________________   _______________________________________   LABS (all labs ordered are listed, but only abnormal results are displayed)  Labs Reviewed  URINALYSIS, COMPLETE (UACMP) WITH MICROSCOPIC - Abnormal; Notable for the following:       Result Value   Color, Urine YELLOW (*)    APPearance CLEAR (*)    Glucose, UA >=500 (*)    Hgb urine dipstick SMALL (*)    Protein, ur 30 (*)    Squamous Epithelial / LPF 0-5 (*)    All other components within normal limits  CBC WITH DIFFERENTIAL/PLATELET - Abnormal; Notable for the following:    RDW 15.4 (*)    Neutro Abs 8.6 (*)    Lymphs Abs 0.9 (*)    All other components within normal limits  BASIC METABOLIC PANEL - Abnormal; Notable for the following:    Glucose, Bld 166 (*)    All other components within normal limits  HEPATIC FUNCTION PANEL - Abnormal; Notable for the following:    Bilirubin, Direct <0.1 (*)    All other components within normal limits  TROPONIN I    No signs of UTI and no signs of acute ischemia __________________________________________  EKG  ED ECG REPORT I, Darel Hong, the attending physician, personally viewed and interpreted this ECG.  Date: 02/05/2017 EKG Time:  Rate:  Rhythm: normal sinus rhythm QRS Axis: normal Intervals: normal ST/T Wave abnormalities: normal Narrative Interpretation: unremarkable  ____________________________________________  RADIOLOGY   ____________________________________________   PROCEDURES  Procedure(s) performed: no  Procedures  Critical Care performed: no  Observation: no ____________________________________________   INITIAL IMPRESSION / ASSESSMENT AND PLAN / ED COURSE  Pertinent labs & imaging results that were available during my care of the patient were reviewed by me and considered in my medical decision making (see chart for details).  The patient arrives profoundly  confused unsure why she is here. She has no objective signs of trauma. EKG and lab work as well as urinalysis are unremarkable. By the time all of this was back her son came to the bedside and said that he found his mom on the ground at home and as he is the sole caretaker he was unable to pick her up it which is why he called 911. He is comfortable having her discharged home with him.      ____________________________________________   FINAL CLINICAL IMPRESSION(S) / ED DIAGNOSES  Final diagnoses:  Fall, initial encounter  Dementia with behavioral disturbance, unspecified dementia type      NEW MEDICATIONS STARTED DURING THIS VISIT:  Discharge Medication List as of 02/05/2017 10:00 AM       Note:  This document was prepared using Dragon voice recognition software and may include unintentional dictation errors.     Darel Hong, MD 02/05/17 [phone]

## 2017-02-05 NOTE — ED Notes (Signed)
In and out cath completed by this nursing student under the supervision of emma, rn. Sterile technique maintained. Patient tolerated well. Clear, yellow urine returned. Soiled brief changed at this time.

## 2017-02-05 NOTE — ED Triage Notes (Signed)
Pt comes from home via [facility] for c/o unwitnessed fall. Per EMS family member found pt on floor. EMS states vomit on floor where pt was found. Pt was able to ambulate with assistance to stretcher for EMS. Pt has hx of dementia. VS BP 191/102, HR-72, and 97% room air. Pt is oriented to person only. Pt denies any pain. Pt appears in NAD at this time. Respirations even and unlabored.

## 2017-02-05 NOTE — ED Notes (Signed)
Yellow socks and arm band placed on patient. Bed alarm applied and on. Bed in lowest position. Call light within reach.

## 2017-02-05 NOTE — Discharge Instructions (Signed)
Please have [name] follow-up with her primary care physician on Monday as needed. Bring her back to the emergency department for any concerns.  It was a pleasure to take care of you today, and thank you for coming to our emergency department.  If you have any questions or concerns before leaving please ask the nurse to grab me and I'm more than happy to go through your aftercare instructions again.  If you were prescribed any opioid pain medication today such as Norco, Vicodin, Percocet, morphine, hydrocodone, or oxycodone please make sure you do not drive when you are taking this medication as it can alter your ability to drive safely.  If you have any concerns once you are home that you are not improving or are in fact getting worse before you can make it to your follow-up appointment, please do not hesitate to call 911 and come back for further evaluation.  Darel Hong MD  Results for orders placed or performed during the hospital encounter of 02/05/17  Urinalysis, Complete w Microscopic  Result Value Ref Range   Color, Urine YELLOW (A) YELLOW   APPearance CLEAR (A) CLEAR   Specific Gravity, Urine 1.020 1.005 - 1.030   pH 6.0 5.0 - 8.0   Glucose, UA >=500 (A) NEGATIVE mg/dL   Hgb urine dipstick SMALL (A) NEGATIVE   Bilirubin Urine NEGATIVE NEGATIVE   Ketones, ur NEGATIVE NEGATIVE mg/dL   Protein, ur 30 (A) NEGATIVE mg/dL   Nitrite NEGATIVE NEGATIVE   Leukocytes, UA NEGATIVE NEGATIVE   RBC / HPF 0-5 0 - 5 RBC/hpf   WBC, UA 0-5 0 - 5 WBC/hpf   Bacteria, UA NONE SEEN NONE SEEN   Squamous Epithelial / LPF 0-5 (A) NONE SEEN   Mucous PRESENT   CBC with Differential  Result Value Ref Range   WBC 9.8 3.6 - 11.0 K/uL   RBC 4.26 3.80 - 5.20 MIL/uL   Hemoglobin 12.2 12.0 - 16.0 g/dL   HCT 37.4 35.0 - 47.0 %   MCV 87.9 80.0 - 100.0 fL   MCH 28.6 26.0 - 34.0 pg   MCHC 32.6 32.0 - 36.0 g/dL   RDW 15.4 (H) 11.5 - 14.5 %   Platelets 290 150 - 440 K/uL   Neutrophils Relative % 88 %     Neutro Abs 8.6 (H) 1.4 - 6.5 K/uL   Lymphocytes Relative 9 %   Lymphs Abs 0.9 (L) 1.0 - 3.6 K/uL   Monocytes Relative 3 %   Monocytes Absolute 0.3 0.2 - 0.9 K/uL   Eosinophils Relative 0 %   Eosinophils Absolute 0.0 0 - 0.7 K/uL   Basophils Relative 0 %   Basophils Absolute 0.0 0 - 0.1 K/uL  Basic metabolic panel  Result Value Ref Range   Sodium 140 135 - 145 mmol/L   Potassium 4.0 3.5 - 5.1 mmol/L   Chloride 104 101 - 111 mmol/L   CO2 28 22 - 32 mmol/L   Glucose, Bld 166 (H) 65 - 99 mg/dL   BUN 20 6 - 20 mg/dL   Creatinine, Ser 0.59 0.44 - 1.00 mg/dL   Calcium 9.6 8.9 - 10.3 mg/dL   GFR calc non Af Amer >60 >60 mL/min   GFR calc Af Amer >60 >60 mL/min   Anion gap 8 5 - 15  Hepatic function panel  Result Value Ref Range   Total Protein 7.4 6.5 - 8.1 g/dL   Albumin 4.0 3.5 - 5.0 g/dL   AST 38 15 -  41 U/L   ALT 32 14 - 54 U/L   Alkaline Phosphatase 90 38 - 126 U/L   Total Bilirubin 0.4 0.3 - 1.2 mg/dL   Bilirubin, Direct <0.1 (L) 0.1 - 0.5 mg/dL   Indirect Bilirubin NOT CALCULATED 0.3 - 0.9 mg/dL  Troponin I  Result Value Ref Range   Troponin I <0.03 <0.03 ng/mL

## 2017-05-02 ENCOUNTER — Emergency Department
Admission: EM | Admit: 2017-05-02 | Discharge: 2017-05-02 | Disposition: A | Discharge status: Home / Self Care | Payer: Medicare Other | Attending: Emergency Medicine | Admitting: Emergency Medicine

## 2017-05-02 ENCOUNTER — Emergency Department: Payer: Medicare Other

## 2017-05-02 DIAGNOSIS — K5900 Constipation, unspecified: Secondary | ICD-10-CM | POA: Diagnosis not present

## 2017-05-02 DIAGNOSIS — F0390 Unspecified dementia without behavioral disturbance: Secondary | ICD-10-CM | POA: Diagnosis not present

## 2017-05-02 DIAGNOSIS — I10 Essential (primary) hypertension: Secondary | ICD-10-CM | POA: Diagnosis not present

## 2017-05-02 DIAGNOSIS — Z7982 Long term (current) use of aspirin: Secondary | ICD-10-CM | POA: Insufficient documentation

## 2017-05-02 DIAGNOSIS — R109 Unspecified abdominal pain: Secondary | ICD-10-CM | POA: Insufficient documentation

## 2017-05-02 DIAGNOSIS — R55 Syncope and collapse: Secondary | ICD-10-CM | POA: Insufficient documentation

## 2017-05-02 DIAGNOSIS — E119 Type 2 diabetes mellitus without complications: Secondary | ICD-10-CM | POA: Insufficient documentation

## 2017-05-02 DIAGNOSIS — E785 Hyperlipidemia, unspecified: Secondary | ICD-10-CM | POA: Insufficient documentation

## 2017-05-02 DIAGNOSIS — Z79899 Other long term (current) drug therapy: Secondary | ICD-10-CM | POA: Insufficient documentation

## 2017-05-02 LAB — BASIC METABOLIC PANEL
ANION GAP: 10 (ref 5–15)
BUN: 15 mg/dL (ref 6–20)
CHLORIDE: 104 mmol/L (ref 101–111)
CO2: 22 mmol/L (ref 22–32)
Calcium: 9.5 mg/dL (ref 8.9–10.3)
Creatinine, Ser: 0.67 mg/dL (ref 0.44–1.00)
GFR calc Af Amer: >60 mL/min (ref >60)
GLUCOSE: 158 mg/dL — AB (ref 65–99)
Potassium: 3.4 mmol/L — ABNORMAL LOW (ref 3.5–5.1)
SODIUM: 136 mmol/L (ref 135–145)

## 2017-05-02 LAB — URINALYSIS, COMPLETE (UACMP) WITH MICROSCOPIC
Bacteria, UA: NONE SEEN
Bilirubin Urine: NEGATIVE
Glucose, UA: 50 mg/dL — AB
Hgb urine dipstick: NEGATIVE
Ketones, ur: NEGATIVE mg/dL
LEUKOCYTES UA: NEGATIVE
NITRITE: NEGATIVE
PROTEIN: 30 mg/dL — AB
SPECIFIC GRAVITY, URINE: 1.019 (ref 1.005–1.030)
pH: 6 (ref 5.0–8.0)

## 2017-05-02 LAB — CBC WITH DIFFERENTIAL/PLATELET
BASOS ABS: 0 10*3/uL (ref 0–0.1)
Basophils Relative: 0 %
EOS PCT: 1 %
Eosinophils Absolute: 0.2 10*3/uL (ref 0–0.7)
HCT: 38.7 % (ref 35.0–47.0)
HEMOGLOBIN: 12.2 g/dL (ref 12.0–16.0)
LYMPHS ABS: 2.1 10*3/uL (ref 1.0–3.6)
LYMPHS PCT: 14 %
MCH: 26.5 pg (ref 26.0–34.0)
MCHC: 31.6 g/dL — ABNORMAL LOW (ref 32.0–36.0)
MCV: 83.9 fL (ref 80.0–100.0)
Monocytes Absolute: 0.5 10*3/uL (ref 0.2–0.9)
Monocytes Relative: 4 %
NEUTROS PCT: 81 %
Neutro Abs: 12 10*3/uL — ABNORMAL HIGH (ref 1.4–6.5)
PLATELETS: 378 10*3/uL (ref 150–440)
RBC: 4.62 MIL/uL (ref 3.80–5.20)
RDW: 16.9 % — ABNORMAL HIGH (ref 11.5–14.5)
WBC: 14.9 10*3/uL — AB (ref 3.6–11.0)

## 2017-05-02 LAB — TROPONIN I

## 2017-05-02 NOTE — ED Triage Notes (Signed)
Pt arrived via [facility] from home with family.  Pt is followed by hospice for dementia.  Family told EMS that pt went unresponsive and had shaking to L arm and bilateral legs.  Pt has no history of this.  Per EMS, pt was awake and at baseline upon arrival.  BS 198, NSR on monitor and negative for stroke.  Per EMS BP running low with 3 BP's being 98/60, 100/80, 88/48.  Pt following commands and able to answer most questions without difficulty.  Pt in NAD at this time.

## 2017-05-02 NOTE — Discharge Instructions (Signed)
Please seek medical attention for any high fevers, chest pain, shortness of breath, change in behavior, persistent vomiting, bloody stool or any other new or concerning symptoms.  

## 2017-05-02 NOTE — ED Provider Notes (Addendum)
[facility] Emergency Department Provider Note   ____________________________________________   I have reviewed the triage vital signs and the nursing notes.   HISTORY  Chief Complaint Hypotension and Weakness   History limited by: Dementia, some history obtained from son   HPI Annette Clements is a 83 y.o. female who presents to the emergency department today after a syncopal episode. The patient was at her dinner table when she told her family members that she started to feel little off. She then became unresponsive. She did have some shaking with that. Fairly quickly however this and didn't she came back to her baseline. Son states that the patient has had a syncopal episode a few months ago. Patient herself is not complaining of any pain anywhere.  DURATION:seconds TIMING: once CONTEXT: was eating MODIFYING FACTORS: none ASSOCIATED SYMPTOMS: shaking  Per medical record patient has a history of dementia, DM, syncope.   Past Medical History:  Diagnosis Date  . Anxiety   . Breast cancer ([place]) 09/2012   Right breast cancer - chemotherapy & lumpectomy  . Dementia   . Diabetes mellitus without complication ([place])   . GERD (gastroesophageal reflux disease)   . HLD (hyperlipidemia)   . HTN (hypertension)     Patient Active Problem List   Diagnosis Date Noted  . Nausea & vomiting 12/25/2016  . Lactic acidosis 12/25/2016  . Prediabetes 12/25/2016  . Syncope 12/24/2016  . Dementia 04/30/2016  . Hypertension 04/30/2016  . Gastric reflux 04/30/2016    Past Surgical History:  Procedure Laterality Date  . BREAST LUMPECTOMY Right 09/2012    Prior to Admission medications   Medication Sig Start Date End Date Taking? Authorizing Provider  acetaminophen (TYLENOL) 500 MG tablet Take 1,000 mg by mouth every 8 (eight) hours as needed.    [provider]  aspirin EC 81 MG tablet Take 81 mg by mouth daily.    [provider]   atorvastatin (LIPITOR) 20 MG tablet Take 20 mg by mouth every evening. 03/18/16   [provider]  glimepiride (AMARYL) 2 MG tablet Take 1 tablet by mouth daily. 12/01/16   [provider]  omeprazole (PRILOSEC) 20 MG capsule Take 20 mg by mouth daily. 02/20/16   [provider]  ondansetron (ZOFRAN) 4 MG tablet Take 1 tablet (4 mg total) by mouth every 6 (six) hours as needed for nausea. 12/25/16   Theodoro Grist, MD  QUEtiapine (SEROQUEL) 100 MG tablet Take 1 tablet by mouth daily. 12/01/16   [provider]  venlafaxine XR (EFFEXOR-XR) 150 MG 24 hr capsule Take 1 capsule by mouth daily. 11/27/16   [provider]    Allergies Atenolol-chlorthalidone; Chlorzoxazone; Codeine; Fluvastatin; Indapamide; Levofloxacin; Propoxyphene; and Verapamil  Family History  Problem Relation Age of Onset  . Cancer Sister     Social History Social History  Substance Use Topics  . Smoking status: Never Smoker  . Smokeless tobacco: Never Used  . Alcohol use No    Review of Systems Constitutional: No fever/chills Eyes: No visual changes. ENT: No sore throat. Cardiovascular: Denies chest pain. Respiratory: Denies shortness of breath. Gastrointestinal: No abdominal pain.  No nausea, no vomiting.  No diarrhea.   Genitourinary: Negative for dysuria. Musculoskeletal: Negative for back pain. Skin: Negative for rash. Neurological: Positive for syncopal episode.  ____________________________________________   PHYSICAL EXAM:  VITAL SIGNS: ED Triage Vitals [05/02/17 1819]  Enc Vitals Group     BP (!) 124/59     Pulse Rate 64  Resp 16     Temp      Temp src      SpO2 95 %     Weight 145 lb (65.8 kg)     Height 5\' 2"  (1.575 m)   Constitutional: Awake and alert. No acute distress. Eyes: Conjunctivae are normal.  ENT   Head: Normocephalic and atraumatic.   Nose: No congestion/rhinnorhea.   Mouth/Throat: Mucous membranes are moist.   Neck:  No stridor. Hematological/Lymphatic/Immunilogical: No cervical lymphadenopathy. Cardiovascular: Normal rate, regular rhythm.  No murmurs, rubs, or gallops.  Respiratory: Normal respiratory effort without tachypnea nor retractions. Breath sounds are clear and equal bilaterally. No wheezes/rales/rhonchi. Gastrointestinal: Soft and non tender. No rebound. No guarding.  Genitourinary: Deferred Musculoskeletal: Normal range of motion in all extremities. No lower extremity edema. Neurologic:  Normal speech and language. No gross focal neurologic deficits are appreciated.  Skin:  Skin is warm, dry and intact. No rash noted. Psychiatric: Mood and affect are normal. Speech and behavior are normal. Patient exhibits appropriate insight and judgment.  ____________________________________________    LABS (pertinent positives/negatives)  Trop <0.03 UA without evidence of infection CBC with WBC of 14.9 BMP wnl except for k 3.4, Glu 158  ____________________________________________  EKG  I, Nance Pear, attending physician, personally viewed and interpreted this EKG  EKG Time: 2122 Rate: 64 Rhythm: normal sinus rhythm Axis: left axis deviation Intervals: qtc 481 QRS: narrow, q waves V1 ST changes: no st elevation Impression: abnormal ekg   RADIOLOGY  Acute abd w/chest No active disease in chest. Signs of constipation   ____________________________________________   PROCEDURES  Procedures  ____________________________________________   INITIAL IMPRESSION / ASSESSMENT AND PLAN / ED COURSE  Pertinent labs & imaging results that were available during my care of the patient were reviewed by me and considered in my medical decision making (see chart for details).  Patient presents to the emergency department today after syncopal episode. Ddx includes anemia, hypotension (poss due to infection), acs, vasovagal amongst other etiologies. WBC with mild leukocytosis but no anemia.  No evidence of PNA or concerning bowel gas pattern on x-ray. Trop negative. Discussed with son possibility of obtaining second troponin however son was okay deferring with the understanding we could be missing cardiac cause of symptoms.   Discussed with patient/family results of testing/physical exam, differential plan and return precautions.  ____________________________________________   FINAL CLINICAL IMPRESSION(S) / ED DIAGNOSES  Final diagnoses:  Abdominal pain  Syncope, unspecified syncope type  Constipation, unspecified constipation type     Note: This dictation was prepared with Dragon dictation. Any transcriptional errors that result from this process are unintentional     Nance Pear, MD 05/02/17 2114    Nance Pear, MD 05/02/17 2125

## 2017-05-02 NOTE — ED Notes (Signed)
Pt found with BM. Pt cleaned and urine obtained.

## 2017-07-28 ENCOUNTER — Emergency Department
Admission: EM | Admit: 2017-07-28 | Discharge: 2017-07-28 | Disposition: A | Discharge status: Home / Self Care | Payer: Medicare Other | Attending: Emergency Medicine | Admitting: Emergency Medicine

## 2017-07-28 ENCOUNTER — Emergency Department: Payer: Medicare Other

## 2017-07-28 ENCOUNTER — Encounter: Payer: Self-pay | Admitting: Intensive Care

## 2017-07-28 DIAGNOSIS — R112 Nausea with vomiting, unspecified: Secondary | ICD-10-CM

## 2017-07-28 DIAGNOSIS — Z7982 Long term (current) use of aspirin: Secondary | ICD-10-CM | POA: Diagnosis not present

## 2017-07-28 DIAGNOSIS — I10 Essential (primary) hypertension: Secondary | ICD-10-CM | POA: Diagnosis not present

## 2017-07-28 DIAGNOSIS — E860 Dehydration: Secondary | ICD-10-CM

## 2017-07-28 DIAGNOSIS — E119 Type 2 diabetes mellitus without complications: Secondary | ICD-10-CM | POA: Insufficient documentation

## 2017-07-28 DIAGNOSIS — F0390 Unspecified dementia without behavioral disturbance: Secondary | ICD-10-CM | POA: Insufficient documentation

## 2017-07-28 DIAGNOSIS — N3001 Acute cystitis with hematuria: Secondary | ICD-10-CM | POA: Diagnosis not present

## 2017-07-28 DIAGNOSIS — Z79899 Other long term (current) drug therapy: Secondary | ICD-10-CM | POA: Diagnosis not present

## 2017-07-28 LAB — URINALYSIS, COMPLETE (UACMP) WITH MICROSCOPIC
GLUCOSE, UA: NEGATIVE mg/dL
Ketones, ur: NEGATIVE mg/dL
NITRITE: POSITIVE — AB
PH: 5.5 (ref 5.0–8.0)
Protein, ur: >300 mg/dL — AB

## 2017-07-28 LAB — BASIC METABOLIC PANEL
ANION GAP: 9 (ref 5–15)
BUN: 26 mg/dL — ABNORMAL HIGH (ref 6–20)
CALCIUM: 9.7 mg/dL (ref 8.9–10.3)
CO2: 28 mmol/L (ref 22–32)
Chloride: 101 mmol/L (ref 101–111)
Creatinine, Ser: 0.66 mg/dL (ref 0.44–1.00)
Glucose, Bld: 117 mg/dL — ABNORMAL HIGH (ref 65–99)
Potassium: 3.9 mmol/L (ref 3.5–5.1)
Sodium: 138 mmol/L (ref 135–145)

## 2017-07-28 LAB — CBC WITH DIFFERENTIAL/PLATELET
BASOS ABS: 0.1 10*3/uL (ref 0–0.1)
BASOS PCT: 0 %
EOS ABS: 0 10*3/uL (ref 0–0.7)
EOS PCT: 0 %
HCT: 43.3 % (ref 35.0–47.0)
Hemoglobin: 13.9 g/dL (ref 12.0–16.0)
Lymphocytes Relative: 19 %
Lymphs Abs: 3.1 10*3/uL (ref 1.0–3.6)
MCH: 27.6 pg (ref 26.0–34.0)
MCHC: 32.1 g/dL (ref 32.0–36.0)
MCV: 85.9 fL (ref 80.0–100.0)
Monocytes Absolute: 1.5 10*3/uL — ABNORMAL HIGH (ref 0.2–0.9)
Monocytes Relative: 9 %
Neutro Abs: 11.5 10*3/uL — ABNORMAL HIGH (ref 1.4–6.5)
Neutrophils Relative %: 72 %
PLATELETS: 364 10*3/uL (ref 150–440)
RBC: 5.04 MIL/uL (ref 3.80–5.20)
RDW: 17.9 % — AB (ref 11.5–14.5)
WBC: 16.1 10*3/uL — ABNORMAL HIGH (ref 3.6–11.0)

## 2017-07-28 MED ORDER — ONDANSETRON HCL 4 MG/2ML IJ SOLN
4.0000 mg | Freq: Once | INTRAMUSCULAR | Status: AC
  Administered 2017-07-28: 4 mg via INTRAVENOUS
  Filled 2017-07-28: qty 2

## 2017-07-28 MED ORDER — SODIUM CHLORIDE 0.9 % IV BOLUS (SEPSIS)
1000.0000 mL | Freq: Once | INTRAVENOUS | Status: AC
  Administered 2017-07-28: 1000 mL via INTRAVENOUS

## 2017-07-28 MED ORDER — CEFTRIAXONE SODIUM IN DEXTROSE 20 MG/ML IV SOLN
1.0000 g | Freq: Once | INTRAVENOUS | Status: AC
  Administered 2017-07-28: 1 g via INTRAVENOUS
  Filled 2017-07-28: qty 50

## 2017-07-28 MED ORDER — CEPHALEXIN 500 MG PO CAPS: 500.0000 mg | ORAL_CAPSULE | Freq: Three times a day (TID) | ORAL | 0 refills | Status: AC

## 2017-07-28 NOTE — ED Triage Notes (Signed)
Patient arrived by EMS from home for emesis. Current hospice patient. Lives with son at home. Son would like for patient to go from here to hospice. HX advanced dementia.

## 2017-07-28 NOTE — ED Provider Notes (Signed)
[facility] Emergency Department Provider Note  ____________________________________________  Time seen: Approximately 6:02 PM  I have reviewed the triage vital signs and the nursing notes.   HISTORY  Chief Complaint Emesis  Level 5 caveat:  Portions of the history and physical were unable to be obtained due to dementia   HPI Annette Clements is a 84 y.o. female with a history of advanced dementia on hospice care, DNR/DNI who presents for evaluation of vomiting. Patient lives with her son. This morning he went to check on her and found her bed covered in vomit. She has been less responsive per him which prompted him to call hospice. He received a call back saying that because today was a holiday there was nothing they could do so patient called 911.  son is concerned that his mother may be dehydrated as she hasn't been eating or drinking much. She has also had a cough for a week. This was her first episode of vomiting this morning. She has had no diarrhea or fever.Patient is non verbal here and son says she is usually able to say a few words and this is worse than her baseline.   Past Medical History:  Diagnosis Date  . Anxiety   . Breast cancer ([place]) 09/2012   Right breast cancer - chemotherapy & lumpectomy  . Dementia   . Diabetes mellitus without complication ([facility])   . GERD (gastroesophageal reflux disease)   . HLD (hyperlipidemia)   . HTN (hypertension)     Patient Active Problem List   Diagnosis Date Noted  . Nausea & vomiting 12/25/2016  . Lactic acidosis 12/25/2016  . Prediabetes 12/25/2016  . Syncope 12/24/2016  . Dementia 04/30/2016  . Hypertension 04/30/2016  . Gastric reflux 04/30/2016    Past Surgical History:  Procedure Laterality Date  . BREAST LUMPECTOMY Right 09/2012    Prior to Admission medications   Medication Sig Start Date End Date Taking? Authorizing Provider  acetaminophen (TYLENOL) 500 MG tablet Take 1,000 mg by  mouth every 8 (eight) hours as needed.    [provider]  aspirin EC 81 MG tablet Take 81 mg by mouth daily.    [provider]  atorvastatin (LIPITOR) 20 MG tablet Take 20 mg by mouth every evening. 03/18/16   [provider]  cephALEXin (KEFLEX) 500 MG capsule Take 1 capsule (500 mg total) by mouth 3 (three) times daily for 7 days. 07/28/17 08/04/17  Rudene Re, MD  glimepiride (AMARYL) 2 MG tablet Take 1 tablet by mouth daily. 12/01/16   [provider]  omeprazole (PRILOSEC) 20 MG capsule Take 20 mg by mouth daily. 02/20/16   [provider]  ondansetron (ZOFRAN) 4 MG tablet Take 1 tablet (4 mg total) by mouth every 6 (six) hours as needed for nausea. 12/25/16   Theodoro Grist, MD  QUEtiapine (SEROQUEL) 100 MG tablet Take 1 tablet by mouth daily. 12/01/16   [provider]  venlafaxine XR (EFFEXOR-XR) 150 MG 24 hr capsule Take 1 capsule by mouth daily. 11/27/16   [provider]    Allergies Atenolol-chlorthalidone; Chlorzoxazone; Codeine; Fluvastatin; Indapamide; Levofloxacin; Propoxyphene; and Verapamil  Family History  Problem Relation Age of Onset  . Cancer Sister     Social History Social History   Tobacco Use  . Smoking status: Never Smoker  . Smokeless tobacco: Never Used  Substance Use Topics  . Alcohol use: No  . Drug use: No    Review of Systems  Constitutional:  Negative for fever. + ams Respiratory: + cough Gastrointestinal: + vomiting, no diarrhea   Level 5 caveat:  Portions of the history and physical were unable to be obtained due to dementia, nonverbal ____________________________________________   PHYSICAL EXAM:  VITAL SIGNS: ED Triage Vitals  Enc Vitals Group     BP 07/28/17 1458 (!) 168/97     Pulse Rate 07/28/17 1458 90     Resp 07/28/17 1458 17     Temp 07/28/17 1458 99.8 F (37.7 C)     Temp Source 07/28/17 1458 Oral     SpO2 07/28/17 1458 96 %     Weight 07/28/17 1500 155 lb (70.3  kg)     Height 07/28/17 1500 5\' 6"  (1.676 m)     Head Circumference --      Peak Flow --      Pain Score --      Pain Loc --      Pain Edu? --      Excl. in [place]? --     Constitutional: Awake, good eye contact, does not follow commands or answer questions HEENT:      Head: Normocephalic and atraumatic.         Eyes: Conjunctivae are normal. Sclera is non-icteric.       Mouth/Throat: Mucous membranes are dry.       Neck: Supple with no signs of meningismus. Cardiovascular: Regular rate and rhythm. No murmurs, gallops, or rubs. 2+ symmetrical distal pulses are present in all extremities. No JVD. Respiratory: Normal respiratory effort. Lungs are clear to auscultation bilaterally. No wheezes, crackles, or rhonchi.  Gastrointestinal: Soft, non tender, and non distended with positive bowel sounds. No rebound or guarding. Musculoskeletal: Nontender with normal range of motion in all extremities. No edema, cyanosis, or erythema of extremities. Neurologic: withdrawal from pain, face symmetric, non verbal, opens eyes spontaneously Skin: Skin is warm, dry and intact. No rash noted.   ____________________________________________   LABS (all labs ordered are listed, but only abnormal results are displayed)  Labs Reviewed  CBC WITH DIFFERENTIAL/PLATELET - Abnormal; Notable for the following components:      Result Value   WBC 16.1 (*)    RDW 17.9 (*)    Neutro Abs 11.5 (*)    Monocytes Absolute 1.5 (*)    All other components within normal limits  BASIC METABOLIC PANEL - Abnormal; Notable for the following components:   Glucose, Bld 117 (*)    BUN 26 (*)    All other components within normal limits  URINALYSIS, COMPLETE (UACMP) WITH MICROSCOPIC - Abnormal; Notable for the following components:   APPearance CLOUDY (*)    Specific Gravity, Urine >1.030 (*)    Hgb urine dipstick MODERATE (*)    Bilirubin Urine SMALL (*)    Protein, ur >300 (*)    Nitrite POSITIVE (*)    Leukocytes, UA  SMALL (*)    Squamous Epithelial / LPF 6-30 (*)    Bacteria, UA FEW (*)    All other components within normal limits  URINE CULTURE   ____________________________________________  EKG  none ____________________________________________  RADIOLOGY  CXR: Cardiomegaly and right-sided scarring. No acute findings. Aortic Atherosclerosis (ICD10-I70.0).  ____________________________________________   PROCEDURES  Procedure(s) performed: None Procedures Critical Care performed:  None ____________________________________________   INITIAL IMPRESSION / ASSESSMENT AND PLAN / ED COURSE   84 y.o. female with a history of advanced dementia on hospice care, DNR/DNI who presents for evaluation of vomiting. the patient is awake and but  does not follow commands or answers any questions. Her vitals are within normal limits. No obvious sources of infection on exam or trauma. No change in neurological exam per son is at the bedside. UA is positive for UTI with a leukocytosis of 16K. No signs of sepsis. Patient given rocephin, IVF, and zofran. Will discuss with Hospice.  Clinical Course as of Jul 29 1831  Tue Jul 28, 2017  1825 Spoke with Gardner Candle, triage nurse for patient's hospice group who tells me they were sending a nurse to see patient at home but patient's son refused and told them he prefer to bring patient to the the ER. She ensure that Hospice nurse will be at the house tomorrow morning for close follow up and reassessment of the patient. Given the fact that patient is not septic, has received rocephin which will cover her for 24 hours and IVF, I believe she is safe for discharge at this time with close re-eval by hospice in the am. She will be sent home on a prescription for keflex. I discussed return precautions with patient's son and also the plan for f/u and he is comfortable with it.   [CV]    Clinical Course User Index [CV] Alfred Levins, [place], MD   _________________________ 6:32 PM on  07/28/2017 -----------------------------------------  after receiving IV fluids patient is now back to her baseline, she is smiling in the room. Patient's son is comfortable taking her back home as long as he can provide transport since she is bedbound. We'll have EMS picked patient back home.  As part of my medical decision making, I reviewed the following data within the [place] History obtained from family, Nursing notes reviewed and incorporated, Labs reviewed , Radiograph reviewed , Notes from prior ED visits and [place] Controlled Substance Database    Pertinent labs & imaging results that were available during my care of the patient were reviewed by me and considered in my medical decision making (see chart for details).    ____________________________________________   FINAL CLINICAL IMPRESSION(S) / ED DIAGNOSES  Final diagnoses:  Dehydration  Non-intractable vomiting with nausea, unspecified vomiting type  Acute cystitis with hematuria      NEW MEDICATIONS STARTED DURING THIS VISIT:  ED Discharge Orders        Ordered    cephALEXin (KEFLEX) 500 MG capsule  3 times daily     07/28/17 1832       Note:  This document was prepared using Dragon voice recognition software and may include unintentional dictation errors.    Rudene Re, MD 07/28/17 [phone]

## 2017-07-28 NOTE — ED Notes (Signed)
Patient cleaned and new brief placed by this RN and Toney Sang

## 2017-07-30 LAB — URINE CULTURE

## 2017-08-28 DEATH — deceased

## 2018-09-27 IMAGING — CR DG CHEST 2V
1 series · 2 of 2 positions shown · non-contrast
Comparison: 05/02/2017 acute abdomen series.

CLINICAL DATA: Cough.  Vomiting.

EXAM:
CHEST  2 VIEW

[Series 1: dg chest 2 view · 0.14mm/px · 2 of 2 slices shown]
[im 1/2]
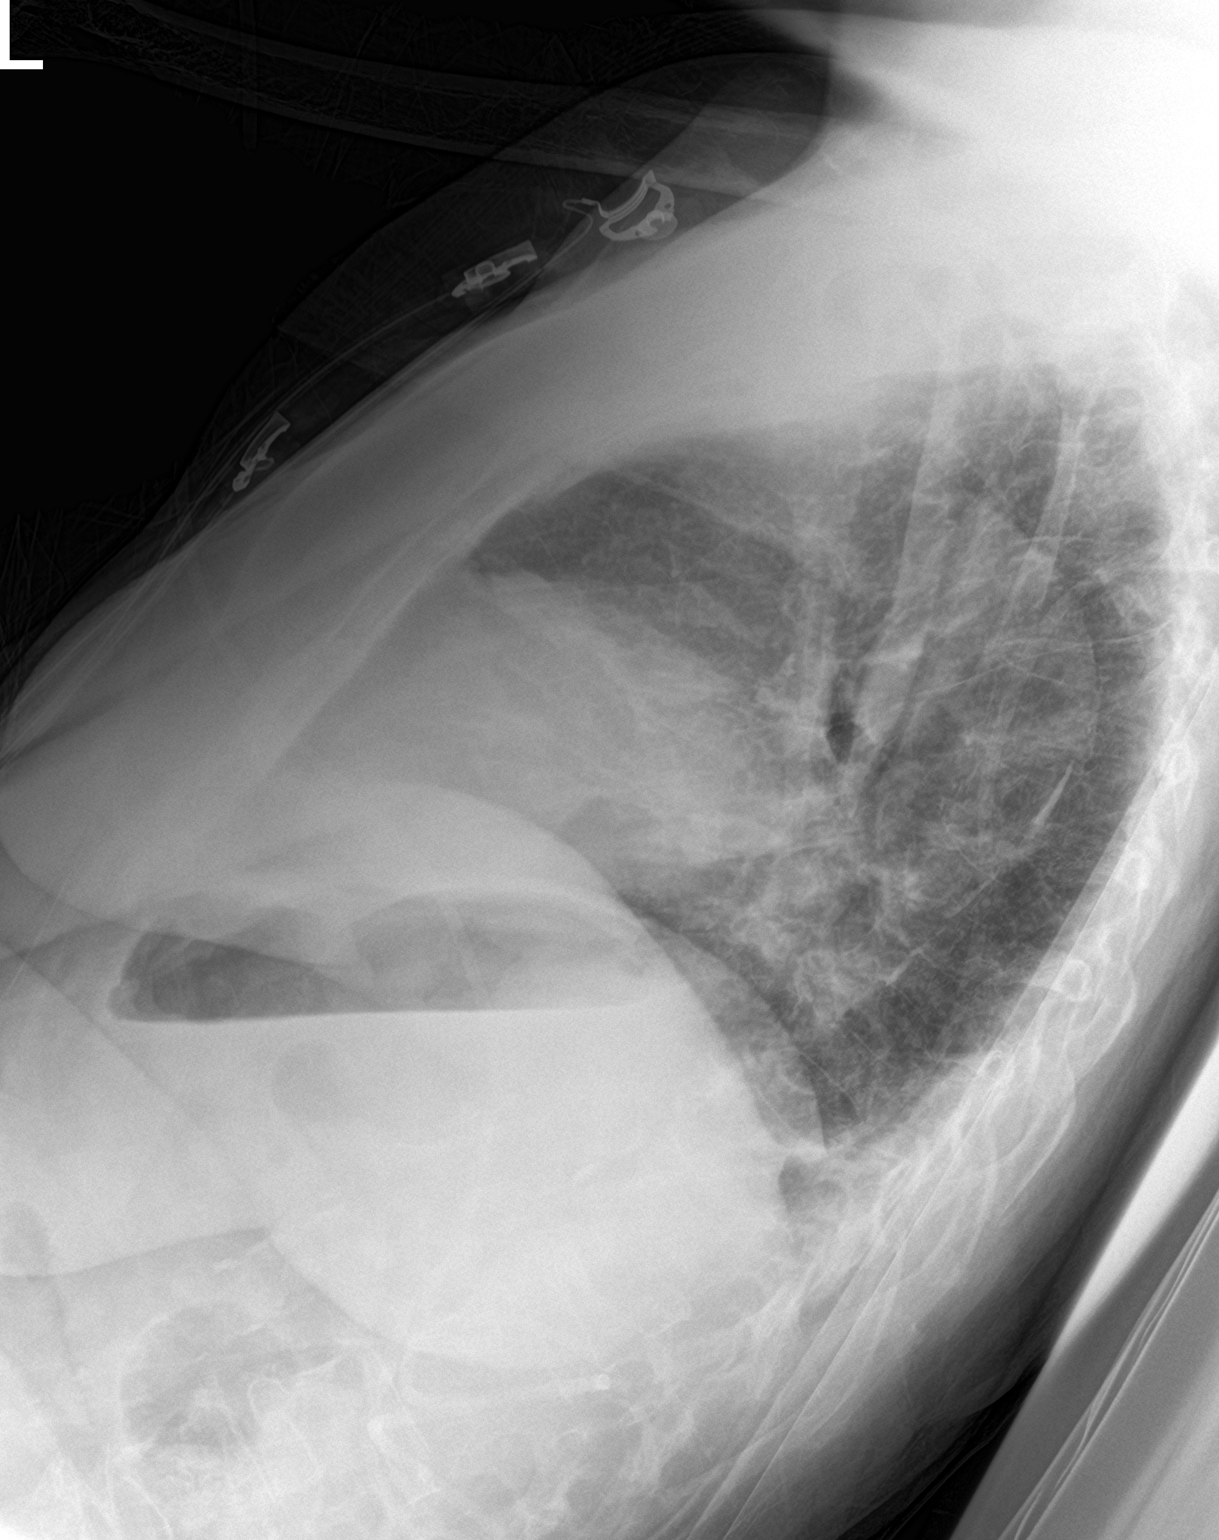
[im 2/2]
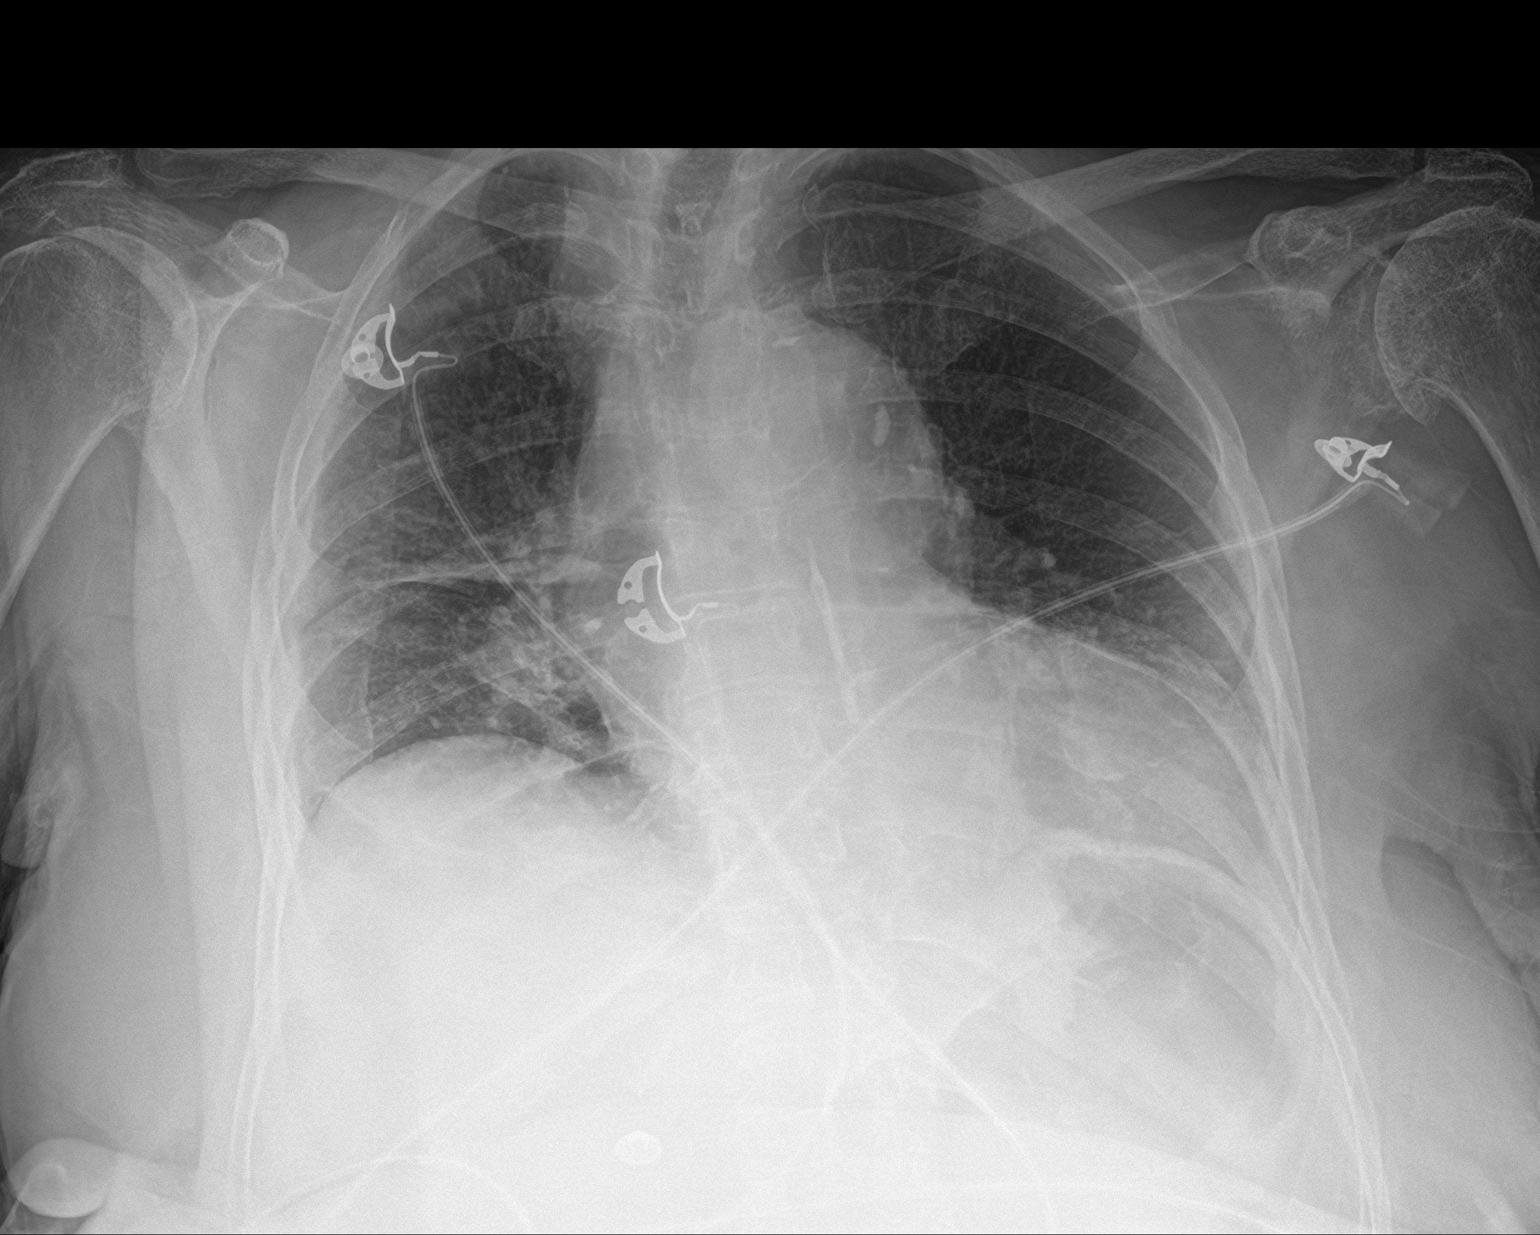

[2 of 2 positions shown; findings below may reference images not displayed]

FINDINGS: Lateral view degraded by patient arm position. Osteopenia. Suspect
lower thoracic vertebral body height loss at multiple levels,
suboptimally evaluated. Right hemidiaphragm elevation. Midline
trachea. Moderate cardiomegaly with a tortuous thoracic aorta. No
pleural effusion or pneumothorax. No congestive failure. Right
perihilar scarring. No lobar consolidation. Aortic atherosclerosis.
IMPRESSION: Cardiomegaly and right-sided scarring.  No acute findings.

Aortic Atherosclerosis ([id number]).
